# Patient Record
Sex: Female | Born: 1990 | Race: Black or African American | Hispanic: No | Marital: Married
Health system: Southern US, Community
[De-identification: ages and names within clinical notes are randomized; demographics above are authoritative.]

## PROBLEM LIST (undated history)

## (undated) DIAGNOSIS — I10 Essential (primary) hypertension: Secondary | ICD-10-CM

## (undated) DIAGNOSIS — D649 Anemia, unspecified: Secondary | ICD-10-CM

## (undated) DIAGNOSIS — E282 Polycystic ovarian syndrome: Secondary | ICD-10-CM

## (undated) DIAGNOSIS — J45909 Unspecified asthma, uncomplicated: Secondary | ICD-10-CM

## (undated) HISTORY — PX: LAPAROSCOPIC GASTRIC SLEEVE RESECTION: SHX5895

## (undated) HISTORY — PX: TUBAL LIGATION: SHX77

---

## 2018-04-29 ENCOUNTER — Encounter (HOSPITAL_COMMUNITY): Payer: Self-pay

## 2018-04-29 ENCOUNTER — Emergency Department (HOSPITAL_COMMUNITY): Payer: Medicaid Other

## 2018-04-29 ENCOUNTER — Emergency Department (HOSPITAL_COMMUNITY)
Admission: EM | Admit: 2018-04-29 | Discharge: 2018-04-29 | Disposition: A | Discharge status: Home / Self Care | Payer: Medicaid Other | Attending: Emergency Medicine | Admitting: Emergency Medicine

## 2018-04-29 ENCOUNTER — Other Ambulatory Visit: Payer: Self-pay

## 2018-04-29 DIAGNOSIS — R1030 Lower abdominal pain, unspecified: Secondary | ICD-10-CM | POA: Insufficient documentation

## 2018-04-29 DIAGNOSIS — I10 Essential (primary) hypertension: Secondary | ICD-10-CM | POA: Insufficient documentation

## 2018-04-29 DIAGNOSIS — Z7722 Contact with and (suspected) exposure to environmental tobacco smoke (acute) (chronic): Secondary | ICD-10-CM | POA: Diagnosis not present

## 2018-04-29 DIAGNOSIS — Z79899 Other long term (current) drug therapy: Secondary | ICD-10-CM | POA: Insufficient documentation

## 2018-04-29 HISTORY — DX: Anemia, unspecified: D64.9

## 2018-04-29 HISTORY — DX: Essential (primary) hypertension: I10

## 2018-04-29 HISTORY — DX: Polycystic ovarian syndrome: E28.2

## 2018-04-29 LAB — COMPREHENSIVE METABOLIC PANEL
ALBUMIN: 3.5 g/dL (ref 3.5–5.0)
ALK PHOS: 70 U/L (ref 38–126)
ALT: 12 U/L (ref 0–44)
AST: 14 U/L — AB (ref 15–41)
Anion gap: 5 (ref 5–15)
BILIRUBIN TOTAL: 0.4 mg/dL (ref 0.3–1.2)
BUN: 7 mg/dL (ref 6–20)
CALCIUM: 8.7 mg/dL — AB (ref 8.9–10.3)
CO2: 23 mmol/L (ref 22–32)
Chloride: 112 mmol/L — ABNORMAL HIGH (ref 98–111)
Creatinine, Ser: 0.64 mg/dL (ref 0.44–1.00)
GFR calc Af Amer: >60 mL/min (ref >60)
GFR calc non Af Amer: >60 mL/min (ref >60)
GLUCOSE: 93 mg/dL (ref 70–99)
Potassium: 3.4 mmol/L — ABNORMAL LOW (ref 3.5–5.1)
Sodium: 140 mmol/L (ref 135–145)
TOTAL PROTEIN: 6.9 g/dL (ref 6.5–8.1)

## 2018-04-29 LAB — CBC
HCT: 34.4 % — ABNORMAL LOW (ref 36.0–46.0)
Hemoglobin: 10.9 g/dL — ABNORMAL LOW (ref 12.0–15.0)
MCH: 23.9 pg — AB (ref 26.0–34.0)
MCHC: 31.7 g/dL (ref 30.0–36.0)
MCV: 75.4 fL — ABNORMAL LOW (ref 78.0–100.0)
Platelets: 323 10*3/uL (ref 150–400)
RBC: 4.56 MIL/uL (ref 3.87–5.11)
RDW: 19 % — ABNORMAL HIGH (ref 11.5–15.5)
WBC: 3.7 10*3/uL — ABNORMAL LOW (ref 4.0–10.5)

## 2018-04-29 LAB — LIPASE, BLOOD: Lipase: 27 U/L (ref 11–51)

## 2018-04-29 LAB — I-STAT BETA HCG BLOOD, ED (MC, WL, AP ONLY)

## 2018-04-29 LAB — URINALYSIS, ROUTINE W REFLEX MICROSCOPIC
BILIRUBIN URINE: NEGATIVE
Glucose, UA: NEGATIVE mg/dL
HGB URINE DIPSTICK: NEGATIVE
KETONES UR: NEGATIVE mg/dL
Leukocytes, UA: NEGATIVE
NITRITE: NEGATIVE
PH: 6 (ref 5.0–8.0)
Protein, ur: NEGATIVE mg/dL
Specific Gravity, Urine: 1.014 (ref 1.005–1.030)

## 2018-04-29 LAB — WET PREP, GENITAL
CLUE CELLS WET PREP: NONE SEEN
Sperm: NONE SEEN
Trich, Wet Prep: NONE SEEN
Yeast Wet Prep HPF POC: NONE SEEN

## 2018-04-29 MED ORDER — FENTANYL CITRATE (PF) 100 MCG/2ML IJ SOLN
50.0000 ug | Freq: Once | INTRAMUSCULAR | Status: AC
  Administered 2018-04-29: 50 ug via INTRAVENOUS
  Filled 2018-04-29: qty 2

## 2018-04-29 MED ORDER — IOPAMIDOL (ISOVUE-300) INJECTION 61%
100.0000 mL | Freq: Once | INTRAVENOUS | Status: AC | PRN
  Administered 2018-04-29: 100 mL via INTRAVENOUS

## 2018-04-29 MED ORDER — SODIUM CHLORIDE 0.9 % IV BOLUS
1000.0000 mL | Freq: Once | INTRAVENOUS | Status: AC
  Administered 2018-04-29: 1000 mL via INTRAVENOUS

## 2018-04-29 MED ORDER — IOPAMIDOL (ISOVUE-300) INJECTION 61%
INTRAVENOUS | Status: AC
  Filled 2018-04-29: qty 100

## 2018-04-29 MED ORDER — ONDANSETRON HCL 4 MG/2ML IJ SOLN
4.0000 mg | Freq: Once | INTRAMUSCULAR | Status: AC
  Administered 2018-04-29: 4 mg via INTRAVENOUS
  Filled 2018-04-29: qty 2

## 2018-04-29 MED ORDER — HYDROMORPHONE HCL 1 MG/ML IJ SOLN
0.5000 mg | Freq: Once | INTRAMUSCULAR | Status: AC
  Administered 2018-04-29: 0.5 mg via INTRAVENOUS
  Filled 2018-04-29: qty 1

## 2018-04-29 NOTE — ED Notes (Signed)
Pelvic cart at bedside. 

## 2018-04-29 NOTE — ED Notes (Signed)
Patient transported to CT 

## 2018-04-29 NOTE — ED Provider Notes (Signed)
  Physical Exam  BP 116/83 (BP Location: Right Arm)   Pulse (!) 59   Temp 98 F (36.7 C) (Oral)   Resp 18   Ht 5\' 6"  (1.676 m)   Wt 122.5 kg (270 lb)   LMP 02/07/2018   SpO2 100%   BMI 43.58 kg/m   Physical Exam  ED Course/Procedures     Procedures  MDM  Care assumed at 4 pm from Dr. Tamera Punt. Patient had abdominal pain and CT ab/pel was unremarkable. Had some more pain so pelvic exam was performed. Wet prep and US pelvis pending.  5:57 PM Wet prep showed some WBC but no clue cells or trich or yeast. Likely vaginal flora. US pelvis unremarkable. She has PCOS. I wonder if she has a ruptured cyst. Pain controlled. Has motrin at home. Stable for discharge.     Drenda Freeze, MD 04/29/18 [phone]

## 2018-04-29 NOTE — ED Notes (Signed)
Pt reports that the pain remains unchanged. Pt reports 9/10 pain. MD made aware

## 2018-04-29 NOTE — ED Triage Notes (Signed)
Patient c/o right mid abdominal pain and vomiting since last night. Patient denies fever or diarrhea.

## 2018-04-29 NOTE — Discharge Instructions (Signed)
Take motrin 800 mg every 6 hrs for pain.   See your doctor.  Rest for 2 days.   Return to ER if you have worse abdominal pain, vomiting, fever

## 2018-04-29 NOTE — ED Provider Notes (Signed)
[facility] DEPT Provider Note   CSN: [id number] Arrival date & time: 04/29/18  1241     History   Chief Complaint Chief Complaint  Patient presents with  . Abdominal Pain  . Emesis    HPI Peggy Andersen is a 27 y.o. female.  Patient is a 27 year old female with a history of hypertension, polycystic ovarian syndrome and prior gastric sleeve surgery who presents with abdominal pain.  She has a 2-day history of pain in her right abdomen.  It radiates a little bit to her back.  No urinary symptoms.  She is had some nausea and vomiting but no diarrhea.  No change in bowel habits.  No history of similar symptoms in the past.  No urinary symptoms other than she is going more frequently than normal.  She denies any known fevers.  She has used ibuprofen without improvement in symptoms.     Past Medical History:  Diagnosis Date  . Anemia   . Hypertension   . PCOS (polycystic ovarian syndrome)     There are no active problems to display for this patient.   Past Surgical History:  Procedure Laterality Date  . LAPAROSCOPIC GASTRIC SLEEVE RESECTION    . TUBAL LIGATION       OB History   None      Home Medications    Prior to Admission medications   Medication Sig Start Date End Date Taking? Authorizing Provider  albuterol (VENTOLIN HFA) 108 (90 Base) MCG/ACT inhaler Inhale into the lungs every 6 (six) hours as needed for wheezing or shortness of breath.   Yes [provider]  amLODipine-benazepril (LOTREL) 10-20 MG capsule Take 1 capsule by mouth daily.   Yes [provider]  CALCIUM PO Take 1 tablet by mouth daily.    Yes [provider]  Cholecalciferol (VITAMIN D PO) Take 1 tablet by mouth daily.    Yes [provider]  Cyanocobalamin (VITAMIN B-12 PO) Take 1 tablet by mouth daily.    Yes [provider]  fluticasone (FLOVENT HFA) 110 MCG/ACT inhaler Inhale 2 puffs into the lungs 2 (two)  times daily.   Yes [provider]  ibuprofen (ADVIL,MOTRIN) 200 MG tablet Take 400 mg by mouth daily as needed (pain).   Yes [provider]  IRON PO Take 1 tablet by mouth daily.    Yes [provider]  omeprazole (PRILOSEC) 40 MG capsule Take 40 mg by mouth daily.   Yes [provider]  thiamine (VITAMIN B-1) 100 MG tablet Take 100 mg by mouth daily.   Yes [provider]    Family History Family History  Problem Relation Age of Onset  . Stroke Mother   . Diabetes Mother   . Hypertension Mother     Social History Social History   Tobacco Use  . Smoking status: Passive Smoke Exposure - Never Smoker  . Smokeless tobacco: Never Used  Substance Use Topics  . Alcohol use: Never    Frequency: Never  . Drug use: Never     Allergies   Patient has no known allergies.   Review of Systems Review of Systems  Constitutional: Negative for chills, diaphoresis, fatigue and fever.  HENT: Negative for congestion, rhinorrhea and sneezing.   Eyes: Negative.   Respiratory: Negative for cough, chest tightness and shortness of breath.   Cardiovascular: Negative for chest pain and leg swelling.  Gastrointestinal: Positive for abdominal pain, nausea and vomiting. Negative for blood in stool and  diarrhea.  Genitourinary: Positive for frequency. Negative for difficulty urinating, flank pain and hematuria.  Musculoskeletal: Negative for arthralgias and back pain.  Skin: Negative for rash.  Neurological: Negative for dizziness, speech difficulty, weakness, numbness and headaches.     Physical Exam Updated Vital Signs BP 131/90 (BP Location: Right Arm)   Pulse 77   Temp 98 F (36.7 C) (Oral)   Resp 18   Ht 5\' 6"  (1.676 m)   Wt 122.5 kg (270 lb)   LMP 02/07/2018   SpO2 100%   BMI 43.58 kg/m   Physical Exam  Constitutional: She is oriented to person, place, and time. She appears well-developed and well-nourished.  HENT:  Head:  Normocephalic and atraumatic.  Eyes: Pupils are equal, round, and reactive to light.  Neck: Normal range of motion. Neck supple.  Cardiovascular: Normal rate, regular rhythm and normal heart sounds.  Pulmonary/Chest: Effort normal and breath sounds normal. No respiratory distress. She has no wheezes. She has no rales. She exhibits no tenderness.  Abdominal: Soft. Bowel sounds are normal. There is tenderness in the right lower quadrant. There is no rebound and no guarding.  Genitourinary:  Genitourinary Comments: Patient has yellow-greenish discharge.  No bleeding.  No cervical motion tenderness.  There is some right adnexal tenderness  Musculoskeletal: Normal range of motion. She exhibits no edema.  Lymphadenopathy:    She has no cervical adenopathy.  Neurological: She is alert and oriented to person, place, and time.  Skin: Skin is warm and dry. No rash noted.  Psychiatric: She has a normal mood and affect.     ED Treatments / Results  Labs (all labs ordered are listed, but only abnormal results are displayed) Labs Reviewed  COMPREHENSIVE METABOLIC PANEL - Abnormal; Notable for the following components:      Result Value   Potassium 3.4 (*)    Chloride 112 (*)    Calcium 8.7 (*)    AST 14 (*)    All other components within normal limits  CBC - Abnormal; Notable for the following components:   WBC 3.7 (*)    Hemoglobin 10.9 (*)    HCT 34.4 (*)    MCV 75.4 (*)    MCH 23.9 (*)    RDW 19.0 (*)    All other components within normal limits  WET PREP, GENITAL  LIPASE, BLOOD  URINALYSIS, ROUTINE W REFLEX MICROSCOPIC  RPR  HIV ANTIBODY (ROUTINE TESTING)  I-STAT BETA HCG BLOOD, ED ([facility], [facility], [facility] ONLY)  GC/CHLAMYDIA PROBE AMP ([place]) NOT AT [facility]    EKG None  Radiology Ct Abdomen Pelvis W Contrast  Result Date: 04/29/2018 CLINICAL DATA:  27 year old female with acute RIGHT abdominal and pelvic pain with nausea for 1 day. History of cholecystectomy, tubal ligation, gastric  sleeve. EXAM: CT ABDOMEN AND PELVIS WITH CONTRAST TECHNIQUE: Multidetector CT imaging of the abdomen and pelvis was performed using the standard protocol following bolus administration of intravenous contrast. CONTRAST:  165mL ISOVUE-300 IOPAMIDOL (ISOVUE-300) INJECTION 61% COMPARISON:  None. FINDINGS: Lower chest: No acute abnormalities. Hepatobiliary: No significant hepatic abnormalities. The patient is status post cholecystectomy. No biliary dilatation. Pancreas: Unremarkable Spleen: Unremarkable Adrenals/Urinary Tract: The kidneys, adrenal glands and bladder are unremarkable. Stomach/Bowel: Gastric surgical changes identified. There is no evidence of bowel obstruction, bowel wall thickening or inflammatory changes. The appendix is normal. Vascular/Lymphatic: No significant vascular findings are present. No enlarged abdominal or pelvic lymph nodes. Reproductive: Uterus and bilateral adnexa are unremarkable except for tubal ligation clips. Other: No  ascites, abscess or pneumoperitoneum. No abdominal wall hernia identified. Musculoskeletal: No acute or significant osseous findings. IMPRESSION: 1. No acute abnormality or CT findings to suggest a cause for this patient's abdominal pain. Normal appendix. Electronically Signed   By: Margarette Canada M.D.   On: 04/29/2018 14:52    Procedures Procedures (including critical care time)  Medications Ordered in ED Medications  iopamidol (ISOVUE-300) 61 % injection (has no administration in time range)  sodium chloride 0.9 % bolus 1,000 mL (1,000 mLs Intravenous New Bag/Given 04/29/18 1447)  fentaNYL (SUBLIMAZE) injection 50 mcg (50 mcg Intravenous Given 04/29/18 1353)  ondansetron (ZOFRAN) injection 4 mg (4 mg Intravenous Given 04/29/18 1353)  iopamidol (ISOVUE-300) 61 % injection 100 mL (100 mLs Intravenous Contrast Given 04/29/18 1429)  HYDROmorphone (DILAUDID) injection 0.5 mg (0.5 mg Intravenous Given 04/29/18 1457)     Initial Impression / Assessment and Plan / ED  Course  I have reviewed the triage vital signs and the nursing notes.  Pertinent labs & imaging results that were available during my care of the patient were reviewed by me and considered in my medical decision making (see chart for details).     Patient is a 27 year old female who presents with abdominal pain.  It seemed to be mostly in the right mid and lower abdomen.  CT scan was performed given her prior abdominal surgeries which showed no acute abnormalities.  A pelvic exam was performed which shows some discharge and right adnexal tenderness.  Urinalysis is pending.  Her labs show mildly low WBC count and some mild anemia.  She does state that she has a history of anemia and has had to have blood transfusions in the past.  I do not have any baseline labs to compare these to.  Patient is awaiting her pelvic ultrasound.  Dr. Darl Householder to follow.  Final Clinical Impressions(s) / ED Diagnoses   Final diagnoses:  None    ED Discharge Orders    None       Malvin Johns, MD 04/29/18 [phone]

## 2018-04-30 LAB — HIV ANTIBODY (ROUTINE TESTING W REFLEX): HIV Screen 4th Generation wRfx: NONREACTIVE

## 2018-04-30 LAB — RPR: RPR Ser Ql: NONREACTIVE

## 2018-05-01 LAB — GC/CHLAMYDIA PROBE AMP (~~LOC~~) NOT AT ARMC
CHLAMYDIA, DNA PROBE: NEGATIVE
NEISSERIA GONORRHEA: NEGATIVE

## 2018-07-15 ENCOUNTER — Other Ambulatory Visit: Payer: Self-pay

## 2018-07-15 ENCOUNTER — Emergency Department (HOSPITAL_COMMUNITY)
Admission: EM | Admit: 2018-07-15 | Discharge: 2018-07-15 | Disposition: A | Discharge status: Home / Self Care | Payer: Medicaid Other | Attending: Emergency Medicine | Admitting: Emergency Medicine

## 2018-07-15 ENCOUNTER — Encounter (HOSPITAL_COMMUNITY): Payer: Self-pay

## 2018-07-15 DIAGNOSIS — Z79899 Other long term (current) drug therapy: Secondary | ICD-10-CM | POA: Diagnosis not present

## 2018-07-15 DIAGNOSIS — J020 Streptococcal pharyngitis: Secondary | ICD-10-CM | POA: Diagnosis not present

## 2018-07-15 DIAGNOSIS — J029 Acute pharyngitis, unspecified: Secondary | ICD-10-CM | POA: Diagnosis present

## 2018-07-15 DIAGNOSIS — I10 Essential (primary) hypertension: Secondary | ICD-10-CM | POA: Insufficient documentation

## 2018-07-15 DIAGNOSIS — Z7722 Contact with and (suspected) exposure to environmental tobacco smoke (acute) (chronic): Secondary | ICD-10-CM | POA: Insufficient documentation

## 2018-07-15 LAB — GROUP A STREP BY PCR: GROUP A STREP BY PCR: DETECTED — AB

## 2018-07-15 MED ORDER — AMOXICILLIN 500 MG PO CAPS: 500.0000 mg | ORAL_CAPSULE | Freq: Two times a day (BID) | ORAL | 0 refills | Status: DC

## 2018-07-15 MED ORDER — AMOXICILLIN 500 MG PO CAPS: 500.0000 mg | ORAL_CAPSULE | Freq: Two times a day (BID) | ORAL | 0 refills | Status: AC

## 2018-07-15 NOTE — Discharge Instructions (Addendum)
You were seen in the ER for sore throat.  You tested positive for strep throat.  This is treated with antibiotic.  Take amoxicillin as prescribed.  Mainstay of treatment includes symptom control and anti-inflammatories.  Take 500 to 1000 mg of acetaminophen and/or 600 mg of ibuprofen every 6-8 hours to help with pain and inflammation.  Warm liquids such as tea with honey help with inflammation.  Return to the ER for worsening pain or swelling, "hot potato" muffled voice, drooling, inability to control your secretions or saliva, neck pain, shortness of breath, asymmetric neck or tonsil swelling or pain.

## 2018-07-15 NOTE — ED Triage Notes (Signed)
Pt presents for evaluation of 2 day hx of sore throat with intermittent headache and decreased appetite. Denies cough. States daughter has same symptoms. Denies fever.

## 2018-07-15 NOTE — ED Provider Notes (Addendum)
[facility] EMERGENCY DEPARTMENT Provider Note   CSN: [id number] Arrival date & time: 07/15/18  7564     History   Chief Complaint Chief Complaint  Patient presents with  . Sore Throat    HPI Peggy Andersen is a 27 y.o. female with history of hypertension is here for evaluation of sore throat.  Onset 2 days ago.  Sore throat is moderate, constant, gradually worsening.  Worse with talking, swallowing.  Has taken ibuprofen with mild, temporary relief.  Associated with voice hoarseness.  Patient 21-year-old daughter has similar symptoms.  She denies associated fever, congestion, ear pain, cough, nausea, vomiting, or abdominal pain.  HPI  Past Medical History:  Diagnosis Date  . Anemia   . Hypertension   . PCOS (polycystic ovarian syndrome)     There are no active problems to display for this patient.   Past Surgical History:  Procedure Laterality Date  . LAPAROSCOPIC GASTRIC SLEEVE RESECTION    . TUBAL LIGATION       OB History   None      Home Medications    Prior to Admission medications   Medication Sig Start Date End Date Taking? Authorizing Provider  albuterol (VENTOLIN HFA) 108 (90 Base) MCG/ACT inhaler Inhale into the lungs every 6 (six) hours as needed for wheezing or shortness of breath.    [provider]  amLODipine-benazepril (LOTREL) 10-20 MG capsule Take 1 capsule by mouth daily.    [provider]  amoxicillin (AMOXIL) 500 MG capsule Take 1 capsule (500 mg total) by mouth 2 (two) times daily for 10 days. 07/15/18 07/25/18  Kinnie Feil, PA-C  CALCIUM PO Take 1 tablet by mouth daily.     [provider]  Cholecalciferol (VITAMIN D PO) Take 1 tablet by mouth daily.     [provider]  Cyanocobalamin (VITAMIN B-12 PO) Take 1 tablet by mouth daily.     [provider]  fluticasone (FLOVENT HFA) 110 MCG/ACT inhaler Inhale 2 puffs into the lungs 2 (two) times daily.    [provider]  ibuprofen (ADVIL,MOTRIN) 200 MG tablet Take 400 mg by mouth daily as needed (pain).    [provider]  IRON PO Take 1 tablet by mouth daily.     [provider]  omeprazole (PRILOSEC) 40 MG capsule Take 40 mg by mouth daily.    [provider]  thiamine (VITAMIN B-1) 100 MG tablet Take 100 mg by mouth daily.    [provider]    Family History Family History  Problem Relation Age of Onset  . Stroke Mother   . Diabetes Mother   . Hypertension Mother     Social History Social History   Tobacco Use  . Smoking status: Passive Smoke Exposure - Never Smoker  . Smokeless tobacco: Never Used  Substance Use Topics  . Alcohol use: Never    Frequency: Never  . Drug use: Never     Allergies   Patient has no known allergies.   Review of Systems Review of Systems  HENT: Positive for sore throat, trouble swallowing and voice change.   All other systems reviewed and are negative.    Physical Exam Updated Vital Signs BP (!) 133/106 (BP Location: Right Arm)   Pulse 81   Temp 98.9 F (37.2 C) (Oral)   Resp 16   LMP 05/28/2018 (Approximate)   SpO2 100%   Physical Exam  Constitutional: She appears well-developed and well-nourished. No distress.  NAD.  HENT:  Head: Normocephalic and atraumatic.  Right Ear: External ear normal.  Left Ear: External ear normal.  Nose: Nose normal.  Mouth/Throat: Mucous membranes are normal. Posterior oropharyngeal erythema present. Tonsils are 1+ on the right. Tonsils are 1+ on the left.  Tonsils and posterior oropharynx are erythematous.  Tonsils are mildly, symmetrically edematous without touching uvula.  Uvula is midline.  No uvula deviation.  No tonsillar or oropharyngeal exudates.  No petechiae.  Normal phonation, no hot potato voice.  Normal protrusion of the tongue.  No sublingual edema or tenderness.  TMs normal.  No significant mucosal edema or rhinorrhea.  Eyes: Conjunctivae and EOM are  normal. No scleral icterus.  Neck: Normal range of motion. Neck supple.  No cervical adenopathy.  No asymmetric anterior neck edema.  Cardiovascular: Normal rate, regular rhythm and normal heart sounds.  Pulmonary/Chest: Effort normal and breath sounds normal.  Musculoskeletal: Normal range of motion. She exhibits no deformity.  Neurological: She is alert.  Skin: Skin is warm and dry. Capillary refill takes less than 2 seconds.  Psychiatric: She has a normal mood and affect. Her behavior is normal. Judgment and thought content normal.  Nursing note and vitals reviewed.    ED Treatments / Results  Labs (all labs ordered are listed, but only abnormal results are displayed) Labs Reviewed  GROUP A STREP BY PCR - Abnormal; Notable for the following components:      Result Value   Group A Strep by PCR DETECTED (*)    All other components within normal limits    EKG None  Radiology No results found.  Procedures Procedures (including critical care time)  Medications Ordered in ED Medications - No data to display   Initial Impression / Assessment and Plan / ED Course  I have reviewed the triage vital signs and the nursing notes.  Pertinent labs & imaging results that were available during my care of the patient were reviewed by me and considered in my medical decision making (see chart for details).     27 y.o. yo female here with sore throat. Given benign symptomatology most likely viral pharyngitis vs strep pharyngitis vs other viral URI. No signs of angioedema or respiratory compromise.. No asymmetry to tonsillar hypertrophy, uvula deviation, hot potato voice, trismus, or drooling to raise suspicion for deep neck space infection such as RPA, ludwig's or PTA. Doubt epiglottitis in this well appearing fully vaccinated patient. Rapid strep positive. Will discharge with amoxicillin, symptomatic management. I have discussed signs and symptoms that would warrant immediate return to ER  and patient verbalized understanding. She is tolerating secretions and PO in ER.   Old records, if available, reviewed by me. Imaging and labs viewed and interpreted by me and used in the medical decision making (formal interpretation from radiologist). Discharge home in stable condition, return precautions discussed. Patient, family agreeable with plan for discharge home.   Final Clinical Impressions(s) / ED Diagnoses   Final diagnoses:  Strep pharyngitis    ED Discharge Orders         Ordered    amoxicillin (AMOXIL) 500 MG capsule  2 times daily,   Status:  Discontinued     07/15/18 1017    amoxicillin (AMOXIL) 500 MG capsule  2 times daily     07/15/18 1044             Arlean Hopping 07/15/18 1138    Jola Schmidt, MD 07/16/18 [phone]

## 2018-09-28 ENCOUNTER — Emergency Department (HOSPITAL_COMMUNITY): Payer: Medicaid Other

## 2018-09-28 ENCOUNTER — Encounter (HOSPITAL_COMMUNITY): Payer: Self-pay

## 2018-09-28 ENCOUNTER — Emergency Department (HOSPITAL_COMMUNITY)
Admission: EM | Admit: 2018-09-28 | Discharge: 2018-09-28 | Disposition: A | Discharge status: Home / Self Care | Payer: Medicaid Other | Attending: Emergency Medicine | Admitting: Emergency Medicine

## 2018-09-28 DIAGNOSIS — I10 Essential (primary) hypertension: Secondary | ICD-10-CM | POA: Diagnosis not present

## 2018-09-28 DIAGNOSIS — Z7722 Contact with and (suspected) exposure to environmental tobacco smoke (acute) (chronic): Secondary | ICD-10-CM | POA: Diagnosis not present

## 2018-09-28 DIAGNOSIS — D649 Anemia, unspecified: Secondary | ICD-10-CM

## 2018-09-28 DIAGNOSIS — J45909 Unspecified asthma, uncomplicated: Secondary | ICD-10-CM | POA: Insufficient documentation

## 2018-09-28 DIAGNOSIS — R110 Nausea: Secondary | ICD-10-CM | POA: Insufficient documentation

## 2018-09-28 DIAGNOSIS — Z9884 Bariatric surgery status: Secondary | ICD-10-CM | POA: Diagnosis not present

## 2018-09-28 DIAGNOSIS — R6883 Chills (without fever): Secondary | ICD-10-CM | POA: Diagnosis not present

## 2018-09-28 DIAGNOSIS — R1011 Right upper quadrant pain: Secondary | ICD-10-CM | POA: Diagnosis not present

## 2018-09-28 HISTORY — DX: Unspecified asthma, uncomplicated: J45.909

## 2018-09-28 LAB — CBC
HCT: 33.5 % — ABNORMAL LOW (ref 36.0–46.0)
Hemoglobin: 9.7 g/dL — ABNORMAL LOW (ref 12.0–15.0)
MCH: 21.7 pg — ABNORMAL LOW (ref 26.0–34.0)
MCHC: 29 g/dL — ABNORMAL LOW (ref 30.0–36.0)
MCV: 74.8 fL — ABNORMAL LOW (ref 80.0–100.0)
Platelets: 355 10*3/uL (ref 150–400)
RBC: 4.48 MIL/uL (ref 3.87–5.11)
RDW: 17.7 % — ABNORMAL HIGH (ref 11.5–15.5)
WBC: 3.9 10*3/uL — ABNORMAL LOW (ref 4.0–10.5)
nRBC: 0 % (ref 0.0–0.2)

## 2018-09-28 LAB — COMPREHENSIVE METABOLIC PANEL
ALBUMIN: 3.7 g/dL (ref 3.5–5.0)
ALT: 12 U/L (ref 0–44)
AST: 13 U/L — ABNORMAL LOW (ref 15–41)
Alkaline Phosphatase: 63 U/L (ref 38–126)
Anion gap: 8 (ref 5–15)
BUN: 7 mg/dL (ref 6–20)
CO2: 22 mmol/L (ref 22–32)
Calcium: 8.6 mg/dL — ABNORMAL LOW (ref 8.9–10.3)
Chloride: 109 mmol/L (ref 98–111)
Creatinine, Ser: 0.65 mg/dL (ref 0.44–1.00)
GFR calc Af Amer: >60 mL/min (ref >60)
GFR calc non Af Amer: >60 mL/min (ref >60)
Glucose, Bld: 82 mg/dL (ref 70–99)
Potassium: 3.6 mmol/L (ref 3.5–5.1)
Sodium: 139 mmol/L (ref 135–145)
Total Bilirubin: 0.4 mg/dL (ref 0.3–1.2)
Total Protein: 7.1 g/dL (ref 6.5–8.1)

## 2018-09-28 LAB — URINALYSIS, ROUTINE W REFLEX MICROSCOPIC
Bilirubin Urine: NEGATIVE
GLUCOSE, UA: NEGATIVE mg/dL
Hgb urine dipstick: NEGATIVE
Ketones, ur: NEGATIVE mg/dL
Nitrite: NEGATIVE
Protein, ur: NEGATIVE mg/dL
Specific Gravity, Urine: 1.016 (ref 1.005–1.030)
pH: 5 (ref 5.0–8.0)

## 2018-09-28 LAB — I-STAT BETA HCG BLOOD, ED (MC, WL, AP ONLY): I-stat hCG, quantitative: <5 m[IU]/mL (ref <5)

## 2018-09-28 LAB — D-DIMER, QUANTITATIVE (NOT AT ARMC): D DIMER QUANT: 0.39 ug{FEU}/mL (ref 0.00–0.50)

## 2018-09-28 LAB — LIPASE, BLOOD: Lipase: 25 U/L (ref 11–51)

## 2018-09-28 MED ORDER — FENTANYL CITRATE (PF) 100 MCG/2ML IJ SOLN
50.0000 ug | Freq: Once | INTRAMUSCULAR | Status: AC
  Administered 2018-09-28: 50 ug via INTRAVENOUS
  Filled 2018-09-28: qty 2

## 2018-09-28 MED ORDER — KETOROLAC TROMETHAMINE 30 MG/ML IJ SOLN
15.0000 mg | Freq: Once | INTRAMUSCULAR | Status: AC
  Administered 2018-09-28: 15 mg via INTRAVENOUS
  Filled 2018-09-28: qty 1

## 2018-09-28 MED ORDER — OMEPRAZOLE 20 MG PO CPDR: 20.0000 mg | DELAYED_RELEASE_CAPSULE | Freq: Every day | ORAL | 0 refills | Status: AC

## 2018-09-28 MED ORDER — HYDROMORPHONE HCL 1 MG/ML IJ SOLN
1.0000 mg | Freq: Once | INTRAMUSCULAR | Status: AC
  Administered 2018-09-28: 1 mg via INTRAVENOUS
  Filled 2018-09-28: qty 1

## 2018-09-28 MED ORDER — IOPAMIDOL (ISOVUE-300) INJECTION 61%
100.0000 mL | Freq: Once | INTRAVENOUS | Status: AC | PRN
  Administered 2018-09-28: 100 mL via INTRAVENOUS

## 2018-09-28 MED ORDER — FAMOTIDINE IN NACL 20-0.9 MG/50ML-% IV SOLN
20.0000 mg | Freq: Once | INTRAVENOUS | Status: AC
  Administered 2018-09-28: 20 mg via INTRAVENOUS
  Filled 2018-09-28: qty 50

## 2018-09-28 MED ORDER — SODIUM CHLORIDE 0.9 % IV BOLUS
1000.0000 mL | Freq: Once | INTRAVENOUS | Status: AC
  Administered 2018-09-28: 1000 mL via INTRAVENOUS

## 2018-09-28 MED ORDER — ONDANSETRON HCL 4 MG/2ML IJ SOLN
4.0000 mg | Freq: Once | INTRAMUSCULAR | Status: AC
  Administered 2018-09-28: 4 mg via INTRAVENOUS
  Filled 2018-09-28: qty 2

## 2018-09-28 MED ORDER — IOPAMIDOL (ISOVUE-300) INJECTION 61%
INTRAVENOUS | Status: AC
  Filled 2018-09-28: qty 100

## 2018-09-28 MED ORDER — OXYCODONE-ACETAMINOPHEN 5-325 MG PO TABS: 2.0000 | ORAL_TABLET | ORAL | 0 refills | Status: DC | PRN

## 2018-09-28 MED ORDER — MORPHINE SULFATE (PF) 4 MG/ML IV SOLN
4.0000 mg | Freq: Once | INTRAVENOUS | Status: AC
  Administered 2018-09-28: 4 mg via INTRAVENOUS
  Filled 2018-09-28: qty 1

## 2018-09-28 MED ORDER — ALUM & MAG HYDROXIDE-SIMETH 200-200-20 MG/5ML PO SUSP
30.0000 mL | Freq: Once | ORAL | Status: AC
  Administered 2018-09-28: 30 mL via ORAL
  Filled 2018-09-28: qty 30

## 2018-09-28 MED ORDER — SODIUM CHLORIDE (PF) 0.9 % IJ SOLN
INTRAMUSCULAR | Status: AC
  Filled 2018-09-28: qty 50

## 2018-09-28 NOTE — ED Notes (Signed)
Patient transported to CT 

## 2018-09-28 NOTE — Discharge Instructions (Addendum)
Please take Percocet for severe pain as needed Take Omeprazole for stomach pain (acid reducer) Follow up with GI  Return if worsening

## 2018-09-28 NOTE — ED Triage Notes (Signed)
Patient c/o abdominal pain Started yesterday morning and progressively gotten worse Right upper sharp abd. 9/10 Pain.  C/O chills Denies n/v or diarrhea.   A/Ox4 Ambulatory in triage.

## 2018-09-28 NOTE — ED Notes (Signed)
Pt sts she is unable to give a urine sample at this time. Pt given a labeled urine cup for when she can urinate

## 2018-09-28 NOTE — ED Provider Notes (Signed)
[facility] DEPT Provider Note   CSN: [id number] Arrival date & time: 09/28/18  1058     History   Chief Complaint Chief Complaint  Patient presents with  . Abdominal Pain    HPI Peggy Andersen is a 27 y.o. female who presents with right upper quadrant abdominal pain.  Past medical history significant for hypertension, PCOS, asthma, anemia.  Patient states that yesterday morning she woke up with right upper quadrant abdominal pain.  Is constant and radiates to her back.  She has been alternating Tylenol and ibuprofen without relief.  Nothing makes it better or worse.  She has never had this pain before.  She reports associated chills.  She has not urinated since yesterday states her urine is dark.  She denies fever, chest pain, cough, shortness of breath, nausea, vomiting, diarrhea, constipation, dysuria, frequency, hematuria, vaginal discharge or bleeding. Past surgical history significant for gastric sleeve resection, tubal ligation, C-section, cholecystectomy. She had a CT and pelvic US in July which was normal.  HPI  Past Medical History:  Diagnosis Date  . Anemia   . Asthma   . Hypertension   . PCOS (polycystic ovarian syndrome)     There are no active problems to display for this patient.   Past Surgical History:  Procedure Laterality Date  . LAPAROSCOPIC GASTRIC SLEEVE RESECTION    . TUBAL LIGATION       OB History   None      Home Medications    Prior to Admission medications   Medication Sig Start Date End Date Taking? Authorizing Provider  amLODipine-benazepril (LOTREL) 10-20 MG capsule Take 1 capsule by mouth daily.   Yes [provider]  ferrous sulfate 325 (65 FE) MG tablet Take 325 mg by mouth 3 (three) times daily with meals.   Yes [provider]    Family History Family History  Problem Relation Age of Onset  . Stroke Mother   . Diabetes Mother   . Hypertension Mother     Social  History Social History   Tobacco Use  . Smoking status: Passive Smoke Exposure - Never Smoker  . Smokeless tobacco: Never Used  Substance Use Topics  . Alcohol use: Never    Frequency: Never  . Drug use: Never     Allergies   Patient has no known allergies.   Review of Systems Review of Systems  Constitutional: Positive for chills. Negative for appetite change and fever.  Respiratory: Negative for cough and shortness of breath.   Cardiovascular: Negative for chest pain and leg swelling.  Gastrointestinal: Positive for abdominal pain. Negative for constipation, diarrhea, nausea and vomiting.  Genitourinary: Negative for difficulty urinating, dysuria, flank pain, frequency, pelvic pain, vaginal bleeding and vaginal discharge.  All other systems reviewed and are negative.    Physical Exam Updated Vital Signs BP (!) 151/108   Pulse 82   Temp 98.3 F (36.8 C) (Oral)   Resp 16   Ht 5\' 6"  (1.676 m)   Wt 121.6 kg   LMP 08/12/2018   SpO2 100%   BMI 43.26 kg/m   Physical Exam  Constitutional: She is oriented to person, place, and time. She appears well-developed and well-nourished. No distress.  Calm and cooperative  HENT:  Head: Normocephalic and atraumatic.  Eyes: Pupils are equal, round, and reactive to light. Conjunctivae are normal. Right eye exhibits no discharge. Left eye exhibits no discharge. No scleral icterus.  Neck: Normal range of motion.  Cardiovascular: Normal rate  and regular rhythm.  Pulmonary/Chest: Effort normal and breath sounds normal. No respiratory distress.  Abdominal: Soft. Bowel sounds are normal. She exhibits no distension and no mass. There is tenderness (RUQ tenderness). There is no rebound and no guarding. No hernia.  No CVA tenderness  Neurological: She is alert and oriented to person, place, and time.  Skin: Skin is warm and dry.  Psychiatric: She has a normal mood and affect. Her behavior is normal.  Nursing note and vitals  reviewed.    ED Treatments / Results  Labs (all labs ordered are listed, but only abnormal results are displayed) Labs Reviewed  COMPREHENSIVE METABOLIC PANEL - Abnormal; Notable for the following components:      Result Value   Calcium 8.6 (*)    AST 13 (*)    All other components within normal limits  CBC - Abnormal; Notable for the following components:   WBC 3.9 (*)    Hemoglobin 9.7 (*)    HCT 33.5 (*)    MCV 74.8 (*)    MCH 21.7 (*)    MCHC 29.0 (*)    RDW 17.7 (*)    All other components within normal limits  URINALYSIS, ROUTINE W REFLEX MICROSCOPIC - Abnormal; Notable for the following components:   Leukocytes, UA TRACE (*)    Bacteria, UA RARE (*)    All other components within normal limits  LIPASE, BLOOD  I-STAT BETA HCG BLOOD, ED ([facility], [facility], [facility] ONLY)    EKG None  Radiology Ct Abdomen Pelvis W Contrast  Result Date: 09/28/2018 CLINICAL DATA:  Abdominal pain. EXAM: CT ABDOMEN AND PELVIS WITH CONTRAST TECHNIQUE: Multidetector CT imaging of the abdomen and pelvis was performed using the standard protocol following bolus administration of intravenous contrast. CONTRAST:  171mL ISOVUE-300 IOPAMIDOL (ISOVUE-300) INJECTION 61% COMPARISON:  CT AP 04/29/2018 FINDINGS: Lower chest: No acute abnormality. Hepatobiliary: There are scattered low-density foci within the liver which appears similar to previous exam. For example within segment 4 a there is a 1.2 cm low attenuation structure, image 18/2. In segment 4B there is a 1.0 cm low attenuation structure, image 30/2. Also similar to previous exam. Within segment 6 there is a 8 mm low-attenuation structure, image 41/2. Unchanged from previous exam. Previous cholecystectomy. Mild fusiform dilatation of the CBD measures up to 9 mm. No choledocholithiasis. Pancreas: Unremarkable. No pancreatic ductal dilatation or surrounding inflammatory changes. Spleen: Normal in size without focal abnormality. Adrenals/Urinary Tract: Normal appearance  of the adrenal glands. No kidney mass or hydronephrosis identified. Urinary bladder appears normal. Stomach/Bowel: Postoperative change from gastric sleeve resection surgery noted. No pathologically dilated loops of large or small bowel identified. No small bowel wall thickening or inflammation identified. The appendix is visualized and appears normal. Vascular/Lymphatic: Normal appearance of the abdominal aorta. No abdominopelvic adenopathy. Reproductive: Uterus and bilateral adnexa are unremarkable. Status post bilateral tubal ligation. Other: No abdominal wall hernia or abnormality. No abdominopelvic ascites. Musculoskeletal: No acute or significant osseous findings. IMPRESSION: 1. No acute findings within the abdomen or pelvis. No significant change compared with 04/29/2018. 2. There are several scattered low-density foci within the liver which are indeterminate. These were present on exam from 04/29/2018 and are favored to represent a benign process such as multiple liver hemangiomas. Suggest followup imaging with nonemergent contrast enhanced MRI of the liver. Electronically Signed   By: Kerby Moors M.D.   On: 09/28/2018 19:56   US Abdomen Limited Ruq  Result Date: 09/28/2018 CLINICAL DATA:  Right upper quadrant pain.  EXAM: ULTRASOUND ABDOMEN LIMITED RIGHT UPPER QUADRANT COMPARISON:  04/29/2018. FINDINGS: Gallbladder: Surgically absent. Common bile duct: Diameter: 7-8 mm Liver: Parenchyma appears subtly heterogeneous with slight increase in echogenicity. Portal vein is patent on color Doppler imaging with normal direction of blood flow towards the liver. IMPRESSION: 1. Status post cholecystectomy. 2. Mild prominence extrahepatic bile ducts, not substantially changed since prior CT. This may reflect changes from prior cholecystectomy. Correlation with liver function tests suggested. Electronically Signed   By: Misty Stanley M.D.   On: 09/28/2018 16:09    Procedures Procedures (including critical care  time)  Medications Ordered in ED Medications  iopamidol (ISOVUE-300) 61 % injection (has no administration in time range)  sodium chloride (PF) 0.9 % injection (has no administration in time range)  alum & mag hydroxide-simeth (MAALOX/MYLANTA) 200-200-20 MG/5ML suspension 30 mL (has no administration in time range)  famotidine (PEPCID) IVPB 20 mg premix (has no administration in time range)  sodium chloride 0.9 % bolus 1,000 mL (0 mLs Intravenous Stopped 09/28/18 1812)  morphine 4 MG/ML injection 4 mg (4 mg Intravenous Given 09/28/18 1540)  HYDROmorphone (DILAUDID) injection 1 mg (1 mg Intravenous Given 09/28/18 1712)  ondansetron (ZOFRAN) injection 4 mg (4 mg Intravenous Given 09/28/18 1712)  iopamidol (ISOVUE-300) 61 % injection 100 mL (100 mLs Intravenous Contrast Given 09/28/18 1859)     Initial Impression / Assessment and Plan / ED Course  I have reviewed the triage vital signs and the nursing notes.  Pertinent labs & imaging results that were available during my care of the patient were reviewed by me and considered in my medical decision making (see chart for details).  27 year old female presents with RUQ abdominal pain. She is hypertensive but otherwise vitals are normal. She is tender in the RUQ. Will order labs, UA. She was given pain control and fluids. RUQ [place] ordered.  CBC is remarkable for leukopenia (3.9) and slightly worse anemia (9.7). CMP and lipase are unremarkable. UA is normal. US shows prominence of bile ducts. Unclear significance of this in the setting of normal liver function.  On recheck she is still very uncomfortable and is now nauseous. Will order additional pain medicine and nausea med. Will order CT abdomen/pelvis. Shared visit with Dr. Lacinda Axon.   7:30 PM CT is normal. Reassessed and she is still uncomfortable. Will give pepcid and mylanta to see if this improves pain. She may have PUD?  9:30 PM Rechecked pt. She is still having significant pain. Nothing we have  given her tonight has helped. Although less likely, will obtain D-dimer and CXR to f/o anything acute in the chest. Toradol and Fentanyl ordered.  Pain is still not improved. CXR and D-dimer are normal. Unclear etiology. She will be given rx for Percocet and Omepazole, GI f/u, and return precautions.  Final Clinical Impressions(s) / ED Diagnoses   Final diagnoses:  RUQ pain  Anemia, unspecified type    ED Discharge Orders    None       Recardo Evangelist, PA-C 09/28/18 2309    Virgel Manifold, MD 09/30/18 1827

## 2018-11-28 ENCOUNTER — Emergency Department (HOSPITAL_COMMUNITY): Payer: Medicaid Other

## 2018-11-28 ENCOUNTER — Emergency Department (HOSPITAL_COMMUNITY)
Admission: EM | Admit: 2018-11-28 | Discharge: 2018-11-28 | Disposition: A | Discharge status: Home / Self Care | Payer: Medicaid Other | Attending: Emergency Medicine | Admitting: Emergency Medicine

## 2018-11-28 DIAGNOSIS — R51 Headache: Secondary | ICD-10-CM | POA: Insufficient documentation

## 2018-11-28 DIAGNOSIS — R000 Tachycardia, unspecified: Secondary | ICD-10-CM | POA: Diagnosis not present

## 2018-11-28 DIAGNOSIS — I10 Essential (primary) hypertension: Secondary | ICD-10-CM | POA: Insufficient documentation

## 2018-11-28 DIAGNOSIS — Z79899 Other long term (current) drug therapy: Secondary | ICD-10-CM | POA: Diagnosis not present

## 2018-11-28 DIAGNOSIS — R600 Localized edema: Secondary | ICD-10-CM | POA: Diagnosis not present

## 2018-11-28 DIAGNOSIS — R55 Syncope and collapse: Secondary | ICD-10-CM | POA: Insufficient documentation

## 2018-11-28 LAB — BASIC METABOLIC PANEL
ANION GAP: 9 (ref 5–15)
BUN: <5 mg/dL — ABNORMAL LOW (ref 6–20)
CO2: 21 mmol/L — ABNORMAL LOW (ref 22–32)
Calcium: 8.3 mg/dL — ABNORMAL LOW (ref 8.9–10.3)
Chloride: 111 mmol/L (ref 98–111)
Creatinine, Ser: 0.66 mg/dL (ref 0.44–1.00)
Glucose, Bld: 64 mg/dL — ABNORMAL LOW (ref 70–99)
Potassium: 3.2 mmol/L — ABNORMAL LOW (ref 3.5–5.1)
Sodium: 141 mmol/L (ref 135–145)

## 2018-11-28 LAB — CBC WITH DIFFERENTIAL/PLATELET
Abs Immature Granulocytes: 0.02 10*3/uL (ref 0.00–0.07)
Basophils Absolute: 0 10*3/uL (ref 0.0–0.1)
Basophils Relative: 0 %
Eosinophils Absolute: 0 10*3/uL (ref 0.0–0.5)
Eosinophils Relative: 1 %
HCT: 32 % — ABNORMAL LOW (ref 36.0–46.0)
HEMOGLOBIN: 9.3 g/dL — AB (ref 12.0–15.0)
Immature Granulocytes: 0 %
Lymphocytes Relative: 36 %
Lymphs Abs: 1.8 10*3/uL (ref 0.7–4.0)
MCH: 21 pg — ABNORMAL LOW (ref 26.0–34.0)
MCHC: 29.1 g/dL — ABNORMAL LOW (ref 30.0–36.0)
MCV: 72.2 fL — ABNORMAL LOW (ref 80.0–100.0)
Monocytes Absolute: 0.4 10*3/uL (ref 0.1–1.0)
Monocytes Relative: 8 %
NEUTROS PCT: 55 %
Neutro Abs: 2.9 10*3/uL (ref 1.7–7.7)
Platelets: 323 10*3/uL (ref 150–400)
RBC: 4.43 MIL/uL (ref 3.87–5.11)
RDW: 17.6 % — ABNORMAL HIGH (ref 11.5–15.5)
WBC: 5.2 10*3/uL (ref 4.0–10.5)
nRBC: 0 % (ref 0.0–0.2)

## 2018-11-28 MED ORDER — SODIUM CHLORIDE 0.9 % IV BOLUS
1000.0000 mL | Freq: Once | INTRAVENOUS | Status: AC
  Administered 2018-11-28: 1000 mL via INTRAVENOUS

## 2018-11-28 MED ORDER — IOPAMIDOL (ISOVUE-370) INJECTION 76%
75.0000 mL | Freq: Once | INTRAVENOUS | Status: AC | PRN
  Administered 2018-11-28: 75 mL via INTRAVENOUS

## 2018-11-28 MED ORDER — IOPAMIDOL (ISOVUE-370) INJECTION 76%
INTRAVENOUS | Status: AC
  Filled 2018-11-28: qty 100

## 2018-11-28 NOTE — ED Triage Notes (Signed)
Pt states she was told by her husband that she passed out this morning. Pt endorses feeling as though her heart was racing. Pt denies chest pain at time of triage. Pt is alert and oriented.

## 2018-11-28 NOTE — ED Notes (Signed)
Pt alert and oriented in NAD. Pt verbalized understanding of discharge instructions. 

## 2018-11-28 NOTE — Discharge Instructions (Addendum)
Follow up with your doctor. Return to ER for any new or worsening symptoms. Home to rest, low salt diet, elevate feet, wear compression hose to help with swelling.

## 2018-11-28 NOTE — ED Notes (Signed)
Patient transported to CT 

## 2018-11-28 NOTE — ED Provider Notes (Signed)
[facility]ERGENCY DEPARTMENT Provider Note   CSN: [id number] Arrival date & time: 11/28/18  1243     History   Chief Complaint Chief Complaint  Patient presents with  . Tachycardia    HPI Peggy Andersen is a 28 y.o. female.  28 year old female presents with complaint of syncope.  Patient states that she did not sleep well last night, was up early to pick her husband up this morning and picked up her medication from the pharmacy.  Patient checked her blood pressure while at the pharmacy like she always does and states that this time her blood pressure was elevated at 170/120, states her blood pressure is normally within normal limits and does not take medication for blood pressure.  Patient states that she went back home and took a nap this morning.  Patient woke up from her nap and sat up on the edge of the bed to go to the bathroom, states that she felt warm all of a sudden, stood up to go to the bathroom and passed out.  Patient's husband was with her at that time, she passed out landing on carpeted floor, awoke to him stating over her calling her name.  Patient was assisted back into bed and later ambulated to the bathroom without further incident.  Patient rested for period of time and then got up this morning and decided she should come to the ER to be seen.  Patient's only complaint at this time is a tender area on the right side of her head where she assumed she may have hit the side of her head on something when she passed out.  Patient denies chest pain, shortness of breath, palpitations.  On exam patient was found to have swollen ankles, she reports that this is new for her this morning, no prior history of same. Patient has a history of anemia, takes an iron supplement, no other medical history.      Past Medical History:  Diagnosis Date  . Anemia   . Asthma   . Hypertension   . PCOS (polycystic ovarian syndrome)     There are no active  problems to display for this patient.   Past Surgical History:  Procedure Laterality Date  . LAPAROSCOPIC GASTRIC SLEEVE RESECTION    . TUBAL LIGATION       OB History   No obstetric history on file.      Home Medications    Prior to Admission medications   Medication Sig Start Date End Date Taking? Authorizing Provider  albuterol (VENTOLIN HFA) 108 (90 Base) MCG/ACT inhaler Inhale 2 puffs into the lungs every 6 (six) hours as needed for wheezing or shortness of breath.   Yes [provider]  ferrous sulfate 325 (65 FE) MG tablet Take 325 mg by mouth 3 (three) times daily with meals.   Yes [provider]  omeprazole (PRILOSEC) 20 MG capsule Take 1 capsule (20 mg total) by mouth daily. 09/28/18  Yes Recardo Evangelist, PA-C  vitamin B-12 (CYANOCOBALAMIN) 1000 MCG tablet Take 1,000 mcg by mouth daily.   Yes [provider]  oxyCODONE-acetaminophen (PERCOCET/ROXICET) 5-325 MG tablet Take 2 tablets by mouth every 4 (four) hours as needed for severe pain. Patient not taking: Reported on 11/28/2018 09/28/18   Recardo Evangelist, PA-C    Family History Family History  Problem Relation Age of Onset  . Stroke Mother   . Diabetes Mother   . Hypertension Mother     Social  History Social History   Tobacco Use  . Smoking status: Passive Smoke Exposure - Never Smoker  . Smokeless tobacco: Never Used  Substance Use Topics  . Alcohol use: Never    Frequency: Never  . Drug use: Never     Allergies   Percocet [oxycodone-acetaminophen]   Review of Systems Review of Systems  Constitutional: Negative for chills and fever.  Respiratory: Negative for shortness of breath.   Cardiovascular: Positive for leg swelling. Negative for chest pain and palpitations.  Gastrointestinal: Negative for abdominal pain, nausea and vomiting.  Musculoskeletal: Negative for arthralgias, back pain, myalgias and neck pain.  Skin: Negative for color change, rash and wound.    Allergic/Immunologic: Negative for immunocompromised state.  Neurological: Positive for syncope and headaches. Negative for dizziness, seizures, speech difficulty and weakness.  Hematological: Does not bruise/bleed easily.  Psychiatric/Behavioral: Negative for confusion.  All other systems reviewed and are negative.    Physical Exam Updated Vital Signs BP 134/89 (BP Location: Right Arm)   Pulse 87   Temp 97.6 F (36.4 C) (Oral)   Resp 19   SpO2 100%   Physical Exam Vitals signs and nursing note reviewed.  Constitutional:      General: She is not in acute distress.    Appearance: She is well-developed. She is not diaphoretic.  HENT:     Head: Normocephalic and atraumatic.     Mouth/Throat:     Mouth: Mucous membranes are moist.  Eyes:     Extraocular Movements: Extraocular movements intact.     Pupils: Pupils are equal, round, and reactive to light.  Neck:     Musculoskeletal: Normal range of motion and neck supple. No muscular tenderness.  Cardiovascular:     Rate and Rhythm: Normal rate and regular rhythm.     Pulses: Normal pulses.     Heart sounds: Normal heart sounds. No murmur.  Pulmonary:     Effort: Pulmonary effort is normal.     Breath sounds: Normal breath sounds.  Abdominal:     Tenderness: There is no abdominal tenderness.  Musculoskeletal:        General: No tenderness or signs of injury.     Right lower leg: Edema present.     Left lower leg: Edema present.  Skin:    General: Skin is warm and dry.     Findings: No erythema or rash.  Neurological:     General: No focal deficit present.     Mental Status: She is alert and oriented to person, place, and time.     Cranial Nerves: No cranial nerve deficit.     Sensory: No sensory deficit.     Motor: No weakness.  Psychiatric:        Behavior: Behavior normal.      ED Treatments / Results  Labs (all labs ordered are listed, but only abnormal results are displayed) Labs Reviewed  BASIC METABOLIC  PANEL - Abnormal; Notable for the following components:      Result Value   Potassium 3.2 (*)    CO2 21 (*)    Glucose, Bld 64 (*)    BUN <5 (*)    Calcium 8.3 (*)    All other components within normal limits  CBC WITH DIFFERENTIAL/PLATELET - Abnormal; Notable for the following components:   Hemoglobin 9.3 (*)    HCT 32.0 (*)    MCV 72.2 (*)    MCH 21.0 (*)    MCHC 29.1 (*)    RDW 17.6 (*)  All other components within normal limits    EKG EKG Interpretation  Date/Time:  Saturday November 28 2018 15:32:06 EST Ventricular Rate:  91 PR Interval:    QRS Duration: 88 QT Interval:  354 QTC Calculation: 436 R Axis:   45 Text Interpretation:  Sinus rhythm No old tracing to compare No acute changes Confirmed by Merrily Pew [phone]) on 11/28/2018 4:36:03 PM   Radiology Ct Head Wo Contrast  Result Date: 11/28/2018 CLINICAL DATA:  Syncopal episode today. Witnessed by pt's husband. Pt unsure if she hit her head. States she feels fine now. Hx: HTN EXAM: CT HEAD WITHOUT CONTRAST TECHNIQUE: Contiguous axial images were obtained from the base of the skull through the vertex without intravenous contrast. COMPARISON:  None. FINDINGS: Brain: No evidence of acute infarction, hemorrhage, hydrocephalus, extra-axial collection or mass lesion/mass effect. Vascular: No hyperdense vessel or unexpected calcification. Skull: Normal. Negative for fracture or focal lesion. Sinuses/Orbits: Visualized globes and orbits are within normal limits. The visualized sinuses and mastoid air cells are clear. Other: None. IMPRESSION: Normal unenhanced CT scan of the brain. Electronically Signed   By: Lajean Manes M.D.   On: 11/28/2018 14:47   Ct Angio Chest Pe W/cm &/or Wo Cm  Result Date: 11/28/2018 CLINICAL DATA:  28 year old female with acute syncope today. EXAM: CT ANGIOGRAPHY CHEST WITH CONTRAST TECHNIQUE: Multidetector CT imaging of the chest was performed using the standard protocol during bolus administration of  intravenous contrast. Multiplanar CT image reconstructions and MIPs were obtained to evaluate the vascular anatomy. CONTRAST:  44mL ISOVUE-370 IOPAMIDOL (ISOVUE-370) INJECTION 76% COMPARISON:  11/28/2018 and 09/28/2018 chest radiographs FINDINGS: Cardiovascular: This is a technically adequate study although respiratory and mild streak artifact in the LOWER lungs slightly decreases sensitivity. No pulmonary emboli are identified. Cardiomegaly noted. No thoracic aortic aneurysm or pericardial effusion. Mediastinum/Nodes: No enlarged mediastinal, hilar, or axillary lymph nodes. Thyroid gland, trachea, and esophagus demonstrate no significant findings. Lungs/Pleura: No airspace disease, consolidation, nodule, mass, pleural effusion or pneumothorax. Equivocal mild ground-glass opacities in a mosaic type pattern may represent small airway disease or less likely edema. Upper Abdomen: No acute abnormality. Gastric surgical changes noted. Musculoskeletal: No acute or suspicious bony abnormalities. Review of the MIP images confirms the above findings. IMPRESSION: 1. No evidence of pulmonary emboli. 2. Cardiomegaly 3. Equivocal mild ground-glass pulmonary opacities in a mosaic type pattern which may represent small airway or less likely edema. Electronically Signed   By: Margarette Canada M.D.   On: 11/28/2018 16:37   Dg Chest Port 1 View  Result Date: 11/28/2018 CLINICAL DATA:  Chest pain and shortness of breath. EXAM: PORTABLE CHEST 1 VIEW COMPARISON:  Chest x-ray dated September 28, 2018. FINDINGS: The heart size and mediastinal contours are within normal limits. Normal pulmonary vascularity. No focal consolidation, pleural effusion, or pneumothorax. IMPRESSION: No active disease. Electronically Signed   By: Titus Dubin M.D.   On: 11/28/2018 14:25    Procedures Procedures (including critical care time)  Medications Ordered in ED Medications  iopamidol (ISOVUE-370) 76 % injection (has no administration in time range)    sodium chloride 0.9 % bolus 1,000 mL (0 mLs Intravenous Stopped 11/28/18 1624)  iopamidol (ISOVUE-370) 76 % injection 75 mL (75 mLs Intravenous Contrast Given 11/28/18 1610)     Initial Impression / Assessment and Plan / ED Course  I have reviewed the triage vital signs and the nursing notes.  Pertinent labs & imaging results that were available during my care of the patient were reviewed by  me and considered in my medical decision making (see chart for details).  Clinical Course as of Nov 29 1711  Sat Nov 28, 4024  7427 28 year old female presents for evaluation after syncopal episode this morning with report of high blood pressure and high heart rate.  Patient denies any complaints other than the right side of her head feels sore where she thinks she may have hit her head on bedroom furniture.  On exam patient is tachycardic (mildly) at times ranging from upper 90s to low 100s on exam.  Patient has mild edema bilateral ankles which she states is new this morning as well.  Denies chest pain or respiratory complaints, states that she felt warm this morning before she stood up and had her syncopal episode.  CT of the head is unremarkable, chest x-ray normal, EKG unremarkable, CBC consistent with history of anemia, BMP with mild hypokalemia with potassium of 3.2.  CTA chest to evaluate for PE due to report of syncope with tachycardia, negative for PE.  Discussed results and plan of care with patient.  Patient continues to feel well and without complaint, her vitals have improved while in the emergency room.  Patient was given 1 L of IV fluids.  Patient to follow-up with her primary care provider, referral given if needed.  Patient return to ER for any further symptoms or any additional syncopal episodes.   [LM]    Clinical Course User Index [LM] Tacy Learn, PA-C   Final Clinical Impressions(s) / ED Diagnoses   Final diagnoses:  Syncope, unspecified syncope type  Bilateral leg edema    ED  Discharge Orders    None       Roque Lias 11/28/18 1713    Daleen Bo, MD 11/28/18 [phone]

## 2019-01-01 ENCOUNTER — Ambulatory Visit: Payer: Medicaid Other | Attending: Nurse Practitioner | Admitting: Nurse Practitioner

## 2019-01-01 ENCOUNTER — Encounter: Payer: Self-pay | Admitting: Nurse Practitioner

## 2019-01-01 VITALS — BP 131/94 | HR 102 | Temp 98.0°F | Ht 66.0 in | Wt 274.4 lb

## 2019-01-01 DIAGNOSIS — R000 Tachycardia, unspecified: Secondary | ICD-10-CM | POA: Diagnosis not present

## 2019-01-01 DIAGNOSIS — R102 Pelvic and perineal pain: Secondary | ICD-10-CM | POA: Insufficient documentation

## 2019-01-01 DIAGNOSIS — Z79899 Other long term (current) drug therapy: Secondary | ICD-10-CM | POA: Insufficient documentation

## 2019-01-01 DIAGNOSIS — J45909 Unspecified asthma, uncomplicated: Secondary | ICD-10-CM | POA: Diagnosis not present

## 2019-01-01 DIAGNOSIS — I10 Essential (primary) hypertension: Secondary | ICD-10-CM | POA: Insufficient documentation

## 2019-01-01 DIAGNOSIS — Z8249 Family history of ischemic heart disease and other diseases of the circulatory system: Secondary | ICD-10-CM | POA: Insufficient documentation

## 2019-01-01 DIAGNOSIS — E876 Hypokalemia: Secondary | ICD-10-CM | POA: Insufficient documentation

## 2019-01-01 DIAGNOSIS — Z7901 Long term (current) use of anticoagulants: Secondary | ICD-10-CM | POA: Insufficient documentation

## 2019-01-01 DIAGNOSIS — D508 Other iron deficiency anemias: Secondary | ICD-10-CM | POA: Insufficient documentation

## 2019-01-01 DIAGNOSIS — G8929 Other chronic pain: Secondary | ICD-10-CM | POA: Diagnosis not present

## 2019-01-01 DIAGNOSIS — R030 Elevated blood-pressure reading, without diagnosis of hypertension: Secondary | ICD-10-CM

## 2019-01-01 DIAGNOSIS — D509 Iron deficiency anemia, unspecified: Secondary | ICD-10-CM | POA: Insufficient documentation

## 2019-01-01 DIAGNOSIS — E282 Polycystic ovarian syndrome: Secondary | ICD-10-CM | POA: Insufficient documentation

## 2019-01-01 MED ORDER — AMLODIPINE BESYLATE 5 MG PO TABS: 5.0000 mg | ORAL_TABLET | Freq: Every day | ORAL | 3 refills | Status: DC

## 2019-01-01 NOTE — Progress Notes (Signed)
Assessment & Plan:  Peggy Andersen was seen today for new patient (initial visit).  Diagnoses and all orders for this visit:  Chronic pelvic pain in female -     Ambulatory referral to Gynecology -     US PELVIC COMPLETE WITH TRANSVAGINAL; Future  Elevated blood pressure reading -     TSH -     D-dimer, quantitative (not at [facility])  Hypokalemia -     CMP14+EGFR  Other iron deficiency anemia -     Ambulatory referral to Hematology  Tachycardia with heart rate 100-120 beats per minute -     D-dimer, quantitative (not at [facility])  Other orders -     amLODipine (NORVASC) 5 MG tablet; Take 1 tablet (5 mg total) by mouth daily.    Patient has been counseled on age-appropriate routine health concerns for screening and prevention. These are reviewed and up-to-date. Referrals have been placed accordingly. Immunizations are up-to-date or declined.    Subjective:   Chief Complaint  Patient presents with  . New Patient (Initial Visit)    Pt. stated she's been having a lot of pelvic pain, been going on for a month.    HPI Peggy Andersen 28 y.o. female presents to office today to establish care and  with complaints of chronic pelvic pain. She has a history of HTN and was previously prescribed amlodipine-benazapril however she denies ever taking any blood pressure medication in the past. Other PMH: lap gastric sleeve resection; PCOS and IDA.   Pelvic Pain: Patient complains of abdominal pain.Worsenining 1 month ago however has a history of PCOS and lower pelvic pain in the past.  She has experienced RLQ pain in the past however the pain gos away on its own. Some nausea and vomiiting. She denies any GU sypmtoms. The pain is described as aching, pressure-like and sharp, and is 6/10 in intensity. Pain is located in the RLQ without radiation. Onset was a few years ago. Symptoms have been waxing and waning since. Aggravating factors: none.  Alleviating factors: none. Associated symptoms: none.  The patient denies constipation, diarrhea, fever, hematochezia, hematuria and melena. She denies daily bowel movements but also denies constipation.  PCOS was diagnosed by gynecology several years ago.    Anemia She has a history of IDA and was seeing hematology in the past for iron infusions. No GI work up. States menstrual cycles are normal. No menorrhagia or metrorrhagia.  Denies chest pain, shortness of breath, palpitations, lightheadedness, dizziness, headaches.    Essential Hypertension Will start amlodipine '5mg'$  today and have patient return in 2-3 weeks for BP recheck. She denies chest pain, shortness of breath, palpitations, lightheadedness, dizziness, headaches visual disturbances or BLE edema.  BP Readings from Last 3 Encounters:  01/01/19 (!) 131/94  11/28/18 138/81  09/28/18 134/90    Review of Systems  Constitutional: Negative for fever, malaise/fatigue and weight loss.  HENT: Negative.  Negative for nosebleeds.   Eyes: Negative.  Negative for blurred vision, double vision and photophobia.  Respiratory: Negative.  Negative for cough and shortness of breath.   Cardiovascular: Negative.  Negative for chest pain, palpitations and leg swelling.  Gastrointestinal: Positive for abdominal pain. Negative for blood in stool, constipation, diarrhea, heartburn, melena, nausea and vomiting.  Musculoskeletal: Negative.  Negative for myalgias.  Neurological: Negative.  Negative for dizziness, focal weakness, seizures and headaches.  Psychiatric/Behavioral: Negative.  Negative for suicidal ideas.    Past Medical History:  Diagnosis Date  . Anemia   . Asthma   .  Hypertension   . PCOS (polycystic ovarian syndrome)     Past Surgical History:  Procedure Laterality Date  . LAPAROSCOPIC GASTRIC SLEEVE RESECTION    . TUBAL LIGATION      Family History  Problem Relation Age of Onset  . Stroke Mother   . Diabetes Mother   . Hypertension Mother     Social History Reviewed with no  changes to be made today.   Outpatient Medications Prior to Visit  Medication Sig Dispense Refill  . albuterol (VENTOLIN HFA) 108 (90 Base) MCG/ACT inhaler Inhale 2 puffs into the lungs every 6 (six) hours as needed for wheezing or shortness of breath.    . ferrous sulfate 325 (65 FE) MG tablet Take 325 mg by mouth 3 (three) times daily with meals.    Marland Kitchen omeprazole (PRILOSEC) 20 MG capsule Take 1 capsule (20 mg total) by mouth daily. 30 capsule 0  . vitamin B-12 (CYANOCOBALAMIN) 1000 MCG tablet Take 1,000 mcg by mouth daily.    Marland Kitchen oxyCODONE-acetaminophen (PERCOCET/ROXICET) 5-325 MG tablet Take 2 tablets by mouth every 4 (four) hours as needed for severe pain. (Patient not taking: Reported on 11/28/2018) 20 tablet 0   No facility-administered medications prior to visit.     Allergies  Allergen Reactions  . Nsaids     Pt. Stated she get severe stomach cramps .  Marland Kitchen Percocet [Oxycodone-Acetaminophen] Other (See Comments)    Severe stomach cramps       Objective:    BP (!) 131/94 (BP Location: Right Arm, Patient Position: Sitting, Cuff Size: Large)   Pulse (!) 102   Temp 98 F (36.7 C) (Oral)   Ht '5\' 6"'$  (1.676 m)   Wt 274 lb 6.4 oz (124.5 kg)   LMP 12/14/2018   SpO2 98%   BMI 44.29 kg/m  Wt Readings from Last 3 Encounters:  01/01/19 274 lb 6.4 oz (124.5 kg)  09/28/18 268 lb (121.6 kg)  04/29/18 270 lb (122.5 kg)    Physical Exam Vitals signs and nursing note reviewed.  Constitutional:      Appearance: She is well-developed.  HENT:     Head: Normocephalic and atraumatic.  Neck:     Musculoskeletal: Normal range of motion.  Cardiovascular:     Rate and Rhythm: Normal rate and regular rhythm.     Heart sounds: Normal heart sounds. No murmur. No friction rub. No gallop.   Pulmonary:     Effort: Pulmonary effort is normal. No tachypnea or respiratory distress.     Breath sounds: Normal breath sounds. No decreased breath sounds, wheezing, rhonchi or rales.  Chest:     Chest  wall: No tenderness.  Abdominal:     General: Abdomen is protuberant. Bowel sounds are normal.     Palpations: Abdomen is soft.     Tenderness: There is abdominal tenderness in the right lower quadrant. There is no guarding or rebound. Positive signs include McBurney's sign. Negative signs include Murphy's sign, Rovsing's sign, psoas sign and obturator sign.  Musculoskeletal: Normal range of motion.  Skin:    General: Skin is warm and dry.  Neurological:     Mental Status: She is alert and oriented to person, place, and time.     Coordination: Coordination normal.  Psychiatric:        Behavior: Behavior normal. Behavior is cooperative.        Thought Content: Thought content normal.        Judgment: Judgment normal.  Patient has been counseled extensively about nutrition and exercise as well as the importance of adherence with medications and regular follow-up. The patient was given clear instructions to go to ER or return to medical center if symptoms don't improve, worsen or new problems develop. The patient verbalized understanding.   Follow-up: Return in about 3 weeks (around 01/22/2019) for BP recheck and PAP.   Gildardo Pounds, FNP-BC [facility], [place]   01/01/2019, 7:26 PM

## 2019-01-02 LAB — CMP14+EGFR
ALT: 9 IU/L (ref 0–32)
AST: 11 IU/L (ref 0–40)
Albumin/Globulin Ratio: 1.6 (ref 1.2–2.2)
Albumin: 4.1 g/dL (ref 3.9–5.0)
Alkaline Phosphatase: 77 IU/L (ref 39–117)
BUN/Creatinine Ratio: 12 (ref 9–23)
BUN: 9 mg/dL (ref 6–20)
Bilirubin Total: <0.2 mg/dL (ref 0.0–1.2)
CO2: 21 mmol/L (ref 20–29)
Calcium: 8.7 mg/dL (ref 8.7–10.2)
Chloride: 107 mmol/L — ABNORMAL HIGH (ref 96–106)
Creatinine, Ser: 0.76 mg/dL (ref 0.57–1.00)
GFR calc Af Amer: 123 mL/min/{1.73_m2} (ref >59)
GFR calc non Af Amer: 107 mL/min/{1.73_m2} (ref >59)
GLUCOSE: 82 mg/dL (ref 65–99)
Globulin, Total: 2.5 g/dL (ref 1.5–4.5)
Potassium: 3.8 mmol/L (ref 3.5–5.2)
Sodium: 141 mmol/L (ref 134–144)
Total Protein: 6.6 g/dL (ref 6.0–8.5)

## 2019-01-02 LAB — TSH: TSH: 0.72 u[IU]/mL (ref 0.450–4.500)

## 2019-01-05 ENCOUNTER — Emergency Department (HOSPITAL_COMMUNITY): Payer: Medicaid Other

## 2019-01-05 ENCOUNTER — Other Ambulatory Visit: Payer: Self-pay

## 2019-01-05 ENCOUNTER — Ambulatory Visit (HOSPITAL_COMMUNITY)
Admission: RE | Admit: 2019-01-05 | Discharge: 2019-01-05 | Disposition: A | Discharge status: Home / Self Care | Payer: Medicaid Other | Source: Ambulatory Visit | Attending: Nurse Practitioner | Admitting: Nurse Practitioner

## 2019-01-05 ENCOUNTER — Emergency Department (HOSPITAL_COMMUNITY)
Admission: EM | Admit: 2019-01-05 | Discharge: 2019-01-06 | Disposition: A | Discharge status: Home / Self Care | Payer: Medicaid Other | Source: Home / Self Care | Attending: Emergency Medicine | Admitting: Emergency Medicine

## 2019-01-05 ENCOUNTER — Encounter (HOSPITAL_COMMUNITY): Payer: Self-pay | Admitting: Emergency Medicine

## 2019-01-05 DIAGNOSIS — I10 Essential (primary) hypertension: Secondary | ICD-10-CM | POA: Insufficient documentation

## 2019-01-05 DIAGNOSIS — Z7722 Contact with and (suspected) exposure to environmental tobacco smoke (acute) (chronic): Secondary | ICD-10-CM

## 2019-01-05 DIAGNOSIS — Z79899 Other long term (current) drug therapy: Secondary | ICD-10-CM | POA: Insufficient documentation

## 2019-01-05 DIAGNOSIS — J45909 Unspecified asthma, uncomplicated: Secondary | ICD-10-CM

## 2019-01-05 DIAGNOSIS — R102 Pelvic and perineal pain: Secondary | ICD-10-CM | POA: Insufficient documentation

## 2019-01-05 DIAGNOSIS — R2243 Localized swelling, mass and lump, lower limb, bilateral: Secondary | ICD-10-CM

## 2019-01-05 DIAGNOSIS — G8929 Other chronic pain: Secondary | ICD-10-CM | POA: Diagnosis present

## 2019-01-05 DIAGNOSIS — R002 Palpitations: Secondary | ICD-10-CM

## 2019-01-05 DIAGNOSIS — R42 Dizziness and giddiness: Secondary | ICD-10-CM

## 2019-01-05 LAB — CBC
HCT: 34.8 % — ABNORMAL LOW (ref 36.0–46.0)
Hemoglobin: 10.1 g/dL — ABNORMAL LOW (ref 12.0–15.0)
MCH: 20.3 pg — ABNORMAL LOW (ref 26.0–34.0)
MCHC: 29 g/dL — ABNORMAL LOW (ref 30.0–36.0)
MCV: 70 fL — ABNORMAL LOW (ref 80.0–100.0)
Platelets: 283 10*3/uL (ref 150–400)
RBC: 4.97 MIL/uL (ref 3.87–5.11)
RDW: 18.4 % — AB (ref 11.5–15.5)
WBC: 7.1 10*3/uL (ref 4.0–10.5)
nRBC: 0 % (ref 0.0–0.2)

## 2019-01-05 LAB — BASIC METABOLIC PANEL
Anion gap: 8 (ref 5–15)
BUN: 9 mg/dL (ref 6–20)
CO2: 17 mmol/L — ABNORMAL LOW (ref 22–32)
Calcium: 9.1 mg/dL (ref 8.9–10.3)
Chloride: 111 mmol/L (ref 98–111)
Creatinine, Ser: 0.78 mg/dL (ref 0.44–1.00)
GFR calc Af Amer: >60 mL/min (ref >60)
Glucose, Bld: 94 mg/dL (ref 70–99)
Potassium: 3.3 mmol/L — ABNORMAL LOW (ref 3.5–5.1)
SODIUM: 136 mmol/L (ref 135–145)

## 2019-01-05 LAB — I-STAT BETA HCG BLOOD, ED (MC, WL, AP ONLY): I-stat hCG, quantitative: <5 m[IU]/mL (ref <5)

## 2019-01-05 LAB — D-DIMER, QUANTITATIVE: D-Dimer, Quant: 0.55 ug/mL-FEU — ABNORMAL HIGH (ref 0.00–0.50)

## 2019-01-05 LAB — I-STAT TROPONIN, ED: Troponin i, poc: 0 ng/mL (ref 0.00–0.08)

## 2019-01-05 MED ORDER — SODIUM CHLORIDE 0.9% FLUSH
3.0000 mL | Freq: Once | INTRAVENOUS | Status: AC
  Administered 2019-01-05: 3 mL via INTRAVENOUS

## 2019-01-05 MED ORDER — IOHEXOL 350 MG/ML SOLN
100.0000 mL | Freq: Once | INTRAVENOUS | Status: AC | PRN
  Administered 2019-01-05: 100 mL via INTRAVENOUS

## 2019-01-05 MED ORDER — SODIUM CHLORIDE 0.9 % IV BOLUS
1000.0000 mL | Freq: Once | INTRAVENOUS | Status: AC
  Administered 2019-01-05: 1000 mL via INTRAVENOUS

## 2019-01-05 NOTE — ED Provider Notes (Signed)
[facility] EMERGENCY DEPARTMENT Provider Note   CSN: [id number] Arrival date & time: 01/05/19  1648    History   Chief Complaint Chief Complaint  Patient presents with  . Chest Pain  . Palpitations  . Dizziness    HPI Peggy Andersen is a 28 y.o. female with a PMH of Asthma, HTN, PCOS, and Anemia presenting with constant dizziness and palpitations onset today at 4pm. Patient describes dizziness as lightheadedness. Patient reports symptoms began while having a transvaginal ultrasound due to chronic pelvic pain. Patient denies shortness of breath, chest pain, nausea, vomiting, diarrhea, or fever. Patient denies cough or congestion. Patient reports traveling by car to [place] earlier this month. Patient reports bilateral leg edema onset today. Patient denies using OCPs, recent surgery, leg pain, or hx of DVT/PE.     HPI  Past Medical History:  Diagnosis Date  . Anemia   . Asthma   . Hypertension   . PCOS (polycystic ovarian syndrome)     Patient Active Problem List   Diagnosis Date Noted  . Chronic pelvic pain in female 01/01/2019  . Absolute anemia 01/01/2019    Past Surgical History:  Procedure Laterality Date  . LAPAROSCOPIC GASTRIC SLEEVE RESECTION    . TUBAL LIGATION       OB History   No obstetric history on file.      Home Medications    Prior to Admission medications   Medication Sig Start Date End Date Taking? Authorizing Provider  albuterol (VENTOLIN HFA) 108 (90 Base) MCG/ACT inhaler Inhale 2 puffs into the lungs every 6 (six) hours as needed for wheezing or shortness of breath.    [provider]  amLODipine (NORVASC) 5 MG tablet Take 1 tablet (5 mg total) by mouth daily. 01/01/19   Gildardo Pounds, NP  ferrous sulfate 325 (65 FE) MG tablet Take 325 mg by mouth 3 (three) times daily with meals.    [provider]  omeprazole (PRILOSEC) 20 MG capsule Take 1 capsule (20 mg total) by mouth daily. 09/28/18   Recardo Evangelist, PA-C  vitamin B-12 (CYANOCOBALAMIN) 1000 MCG tablet Take 1,000 mcg by mouth daily.    [provider]    Family History Family History  Problem Relation Age of Onset  . Stroke Mother   . Diabetes Mother   . Hypertension Mother     Social History Social History   Tobacco Use  . Smoking status: Passive Smoke Exposure - Never Smoker  . Smokeless tobacco: Never Used  Substance Use Topics  . Alcohol use: Never    Frequency: Never  . Drug use: Never     Allergies   Nsaids and Percocet [oxycodone-acetaminophen]   Review of Systems Review of Systems  Constitutional: Negative for activity change, appetite change, chills, diaphoresis, fatigue, fever and unexpected weight change.  HENT: Negative for congestion and rhinorrhea.   Eyes: Negative for visual disturbance.  Respiratory: Negative for cough, chest tightness, shortness of breath and wheezing.   Cardiovascular: Positive for palpitations and leg swelling. Negative for chest pain.  Gastrointestinal: Negative for abdominal pain, nausea and vomiting.  Endocrine: Negative for cold intolerance and heat intolerance.  Musculoskeletal: Negative for back pain.  Skin: Negative for rash.  Allergic/Immunologic: Negative for immunocompromised state.  Neurological: Positive for dizziness and light-headedness. Negative for syncope and weakness.  Psychiatric/Behavioral: Negative for agitation and behavioral problems. The patient is not nervous/anxious.      Physical Exam Updated Vital Signs BP (!) 92/54  Pulse (!) 107   Temp 98.2 F (36.8 C) (Oral)   Resp 19   Ht 5\' 6"  (1.676 m)   Wt 124.3 kg   LMP 12/14/2018   SpO2 100%   BMI 44.22 kg/m   Physical Exam Vitals signs and nursing note reviewed.  Constitutional:      General: She is not in acute distress.    Appearance: She is well-developed. She is not diaphoretic.  HENT:     Head: Normocephalic and atraumatic.  Eyes:     Extraocular Movements:  Extraocular movements intact.     Pupils: Pupils are equal, round, and reactive to light.  Neck:     Musculoskeletal: Normal range of motion and neck supple.  Cardiovascular:     Rate and Rhythm: Normal rate and regular rhythm.     Heart sounds: Normal heart sounds. No murmur. No friction rub. No gallop.   Pulmonary:     Effort: Pulmonary effort is normal. No respiratory distress.     Breath sounds: Normal breath sounds. No decreased breath sounds, wheezing, rhonchi or rales.  Chest:     Chest wall: No tenderness.  Abdominal:     Palpations: Abdomen is soft.  Musculoskeletal: Normal range of motion.     Right lower leg: She exhibits no tenderness. Edema present.     Left lower leg: She exhibits no tenderness. Edema present.  Skin:    General: Skin is warm.     Findings: No erythema or rash.  Neurological:     Mental Status: She is alert and oriented to person, place, and time.    Mental Status:  Alert, oriented, thought content appropriate, able to give a coherent history. Speech fluent without evidence of aphasia. Able to follow 2 step commands without difficulty.  Cranial Nerves:  II:  Peripheral visual fields grossly normal, pupils equal, round, reactive to light III,IV, VI: ptosis not present, extra-ocular motions intact bilaterally  V,VII: smile symmetric, facial light touch sensation equal VIII: hearing grossly normal to voice  X: uvula elevates symmetrically  XI: bilateral shoulder shrug symmetric and strong XII: midline tongue extension without fassiculations Motor:  Normal tone. 5/5 in upper and lower extremities bilaterally including strong and equal grip strength and dorsiflexion/plantar flexion Sensory: light touch normal in all extremities.  Deep Tendon Reflexes: 2+ and symmetric in the biceps and patella Cerebellar: normal finger-to-nose with bilateral upper extremities Gait: normal gait and balance without difficulty.  CV: distal pulses palpable throughout    ED Treatments / Results  Labs (all labs ordered are listed, but only abnormal results are displayed) Labs Reviewed  BASIC METABOLIC PANEL - Abnormal; Notable for the following components:      Result Value   Potassium 3.3 (*)    CO2 17 (*)    All other components within normal limits  CBC - Abnormal; Notable for the following components:   Hemoglobin 10.1 (*)    HCT 34.8 (*)    MCV 70.0 (*)    MCH 20.3 (*)    MCHC 29.0 (*)    RDW 18.4 (*)    All other components within normal limits  D-DIMER, QUANTITATIVE (NOT AT [facility]) - Abnormal; Notable for the following components:   D-Dimer, Quant 0.55 (*)    All other components within normal limits  I-STAT TROPONIN, ED  I-STAT BETA HCG BLOOD, ED ([facility], [facility], [facility] ONLY)    EKG None   ED ECG REPORT   Date: 01/05/2019   Rate: 103  Rhythm: sinus  tachycardia  QRS Axis: normal  Intervals: normal  ST/T Wave abnormalities: Borderline T wave abnormalities  Conduction Disutrbances: none  Narrative Interpretation: sinus tachycardia  Old EKG Reviewed: Slightly faster rate than previous EKG otherwise no changes.   Radiology Dg Chest 2 View  Result Date: 01/05/2019 CLINICAL DATA:  Initial evaluation for acute palpitations. EXAM: CHEST - 2 VIEW COMPARISON:  Prior radiograph from 11/28/2018. FINDINGS: The cardiac and mediastinal silhouettes are stable in size and contour, and remain within normal limits. The lungs are normally inflated. No airspace consolidation, pleural effusion, or pulmonary edema is identified. There is no pneumothorax. No acute osseous abnormality identified. IMPRESSION: No active cardiopulmonary disease. Electronically Signed   By: Jeannine Boga M.D.   On: 01/05/2019 18:37   Ct Angio Chest Pe W/cm &/or Wo Cm  Result Date: 01/05/2019 CLINICAL DATA:  28 year old female with tachycardia, palpitations. Abnormal D-dimer. EXAM: CT ANGIOGRAPHY CHEST WITH CONTRAST TECHNIQUE: Multidetector CT imaging of the chest was performed  using the standard protocol during bolus administration of intravenous contrast. Multiplanar CT image reconstructions and MIPs were obtained to evaluate the vascular anatomy. CONTRAST:  139 milliliters OMNIPAQUE IOHEXOL 350 MG/ML SOLN COMPARISON:  Chest radiographs earlier today. CTA chest 11/28/2018. FINDINGS: Cardiovascular: Poor contrast bolus timing in the pulmonary arterial tree despite a repeated attempt with a 2nd contrast dose (less than 150 Hounsfield units in the main PA both times). The central and proximal lower lobe pulmonary arteries are best evaluated and no filling defect is identified in those segments. Negative visible aorta. Stable cardiac size at the upper limits of normal. No pericardial effusion. Mediastinum/Nodes: Stable and negative. Lungs/Pleura: Major airways are patent. Similar low lung volumes. Minor pulmonary atelectasis but no pleural effusion or other abnormal pulmonary opacity. Upper Abdomen: Stable and negative; partially visible postoperative changes to the stomach. Musculoskeletal: Negative. Review of the MIP images confirms the above findings. IMPRESSION: 1. Poor contrast bolus timing despite two attempts. 2. No central or proximal lower lobe pulmonary embolus. If clinical suspicion persists recommend Nuclear Medicine V/Q scan. 3. Otherwise negative chest CTA. Electronically Signed   By: Genevie Ann M.D.   On: 01/05/2019 21:05   US Pelvic Complete With Transvaginal  Result Date: 01/05/2019 CLINICAL DATA:  Chronic pelvic pain EXAM: TRANSABDOMINAL AND TRANSVAGINAL ULTRASOUND OF PELVIS DOPPLER ULTRASOUND OF OVARIES TECHNIQUE: Both transabdominal and transvaginal ultrasound examinations of the pelvis were performed. Transabdominal technique was performed for global imaging of the pelvis including uterus, ovaries, adnexal regions, and pelvic cul-de-sac. It was necessary to proceed with endovaginal exam following the transabdominal exam to visualize the uterus endometrium ovaries. Color  and duplex Doppler ultrasound was utilized to evaluate blood flow to the ovaries. COMPARISON:  Ultrasound 04/29/2018 FINDINGS: Uterus Measurements: 8.1 x 5.3 x 5.5 cm = volume: 123.3 mL. Possible subtle iso to mildly hypoechoic anterior fundal mass measuring 2.7 x 2.5 x 2.1 cm. Endometrium Thickness: 13.6 mm.  No focal abnormality visualized. Right ovary Measurements: 2.8 x 1.9 x 2 cm = volume: 5.6 mL. Normal appearance/no adnexal mass. Left ovary Measurements: 4 x 2.6 x 3.1 cm = volume: 16.6 mL. Normal appearance/no adnexal mass. Pulsed Doppler evaluation of both ovaries demonstrates normal low-resistance arterial and venous waveforms. Other findings No abnormal free fluid. IMPRESSION: 1. Negative for ovarian torsion or suspicious ovarian mass lesion. 2. Possible anterior fundal fibroid. Electronically Signed   By: Donavan Foil M.D.   On: 01/05/2019 20:11    Procedures Procedures (including critical care time)  Medications Ordered in ED Medications  sodium chloride flush (NS) 0.9 % injection 3 mL (3 mLs Intravenous Given 01/05/19 1956)  sodium chloride 0.9 % bolus 1,000 mL (1,000 mLs Intravenous New Bag/Given 01/05/19 1957)  iohexol (OMNIPAQUE) 350 MG/ML injection 100 mL (100 mLs Intravenous Contrast Given 01/05/19 2013)     Initial Impression / Assessment and Plan / ED Course  I have reviewed the triage vital signs and the nursing notes.  Pertinent labs & imaging results that were available during my care of the patient were reviewed by me and considered in my medical decision making (see chart for details).  Clinical Course as of Jan 04 2114  Tue Jan 05, 2019  1744 Hemoglobin low at 10.1. This appears to have improved from baseline.   Hemoglobin(!): 10.1 [AH]  1844 No active cardiopulmonary disease noted on CXR.  DG Chest 2 View [AH]  [facility] 20.3 [AH]  2108 Poor contrast bolus timing despite two attempts. No central or proximal lower lobe pulmonary embolus. Otherwise negative chest  CTA.    CT Angio Chest PE W/Cm &/Or Wo Cm [AH]    Clinical Course User Index [AH] Arville Lime, PA-C      Patient presents with dizziness and palpitations. CXR does not reveal acute cardiopulmonary disease. Hemoglobin is low, but appears to be better than baseline. Patient had positive orthostatic vitals. Provided IVF while in the ER. Ordered CTA due to tachycardia and symptoms. CTA is negative. Findings and plan of care discussed with supervising physician Dr. Ronnald Nian. IVF are pending. Dr. Ronnald Nian re-evaluate and discharge patient once IVF are finished.   Final Clinical Impressions(s) / ED Diagnoses   Final diagnoses:  Dizziness  Palpitations    ED Discharge Orders    None       Arville Lime, PA-C 01/05/19 2118    Lennice Sites, DO 01/05/19 [place], [place], DO 01/05/19 2222

## 2019-01-05 NOTE — ED Notes (Signed)
Pt ambulatory to bathroom without any problems 

## 2019-01-05 NOTE — ED Notes (Signed)
ED Provider at bedside. 

## 2019-01-05 NOTE — ED Triage Notes (Signed)
Pt. Stated, I had an appt upstairs for [place] and then my heart was racing and I felt really dizzy. I came down here.

## 2019-01-05 NOTE — Discharge Instructions (Addendum)
You have been seen today for palpitations and dizziness. Please read and follow all provided instructions.   1. Medications: usual home medications 2. Treatment: rest, drink plenty of fluids, stay hydrated 3. Follow Up: Please follow up with your primary doctor in 2 days for discussion of your diagnoses and further evaluation after today's visit; if you do not have a primary care doctor use the resource guide provided to find one; Please return to the ER for any new or worsening symptoms. Please obtain all of your results from medical records or have your doctors office obtain the results - share them with your doctor - you should be seen at your doctors office. Call today to arrange your follow up.   Take medications as prescribed. Please review all of the medicines and only take them if you do not have an allergy to them. Return to the emergency room for worsening condition or new concerning symptoms. Follow up with your regular doctor. If you don't have a regular doctor use one of the numbers below to establish a primary care doctor.  Please be aware that if you are taking birth control pills, taking other prescriptions, ESPECIALLY ANTIBIOTICS may make the birth control ineffective - if this is the case, either do not engage in sexual activity or use alternative methods of birth control such as condoms until you have finished the medicine and your family doctor says it is OK to restart them. If you are on a blood thinner such as COUMADIN, be aware that any other medicine that you take may cause the coumadin to either work too much, or not enough - you should have your coumadin level rechecked in next 7 days if this is the case.  ?  It is also a possibility that you have an allergic reaction to any of the medicines that you have been prescribed - Everybody reacts differently to medications and while MOST people have no trouble with most medicines, you may have a reaction such as nausea, vomiting, rash,  swelling, shortness of breath. If this is the case, please stop taking the medicine immediately and contact your physician.  ?  You should return to the ER if you develop severe or worsening symptoms.   Emergency Department Resource Guide 1) Find a Doctor and Pay Out of Pocket Although you won't have to find out who is covered by your insurance plan, it is a good idea to ask around and get recommendations. You will then need to call the office and see if the doctor you have chosen will accept you as a new patient and what types of options they offer for patients who are self-pay. Some doctors offer discounts or will set up payment plans for their patients who do not have insurance, but you will need to ask so you aren't surprised when you get to your appointment.  2) Contact Your Local Health Department Not all health departments have doctors that can see patients for sick visits, but many do, so it is worth a call to see if yours does. If you don't know where your local health department is, you can check in your phone book. The CDC also has a tool to help you locate your state's health department, and many state websites also have listings of all of their local health departments.  3) Find a [facility] If your illness is not likely to be very severe or complicated, you may want to try a walk in clinic. These are popping  up all over the country in pharmacies, drugstores, and shopping centers. They're usually staffed by nurse practitioners or physician assistants that have been trained to treat common illnesses and complaints. They're usually fairly quick and inexpensive. However, if you have serious medical issues or chronic medical problems, these are probably not your best option.  No Primary Care Doctor: Call Health Connect at  [phone] - they can help you locate a primary care doctor that  accepts your insurance, provides certain services, etc. Physician Referral [phone]  Emergency Department Resource Guide 1) Find a Doctor and Pay Out of Pocket Although you won't have to find out who is covered by your insurance plan, it is a good idea to ask around and get recommendations. You will then need to call the office and see if the doctor you have chosen will accept you as a new patient and what types of options they offer for patients who are self-pay. Some doctors offer discounts or will set up payment plans for their patients who do not have insurance, but you will need to ask so you aren't surprised when you get to your appointment.  2) Contact Your Local Health Department Not all health departments have doctors that can see patients for sick visits, but many do, so it is worth a call to see if yours does. If you don't know where your local health department is, you can check in your phone book. The CDC also has a tool to help you locate your state's health department, and many state websites also have listings of all of their local health departments.  3) Find a [facility] If your illness is not likely to be very severe or complicated, you may want to try a walk in clinic. These are popping up all over the country in pharmacies, drugstores, and shopping centers. They're usually staffed by nurse practitioners or physician assistants that have been trained to treat common illnesses and complaints. They're usually fairly quick and inexpensive. However, if you have serious medical issues or chronic medical problems, these are probably not your best option.  No Primary Care Doctor: Call Health Connect at  [phone] - they can help you locate a primary care doctor that  accepts your insurance, provides certain services, etc. Physician Referral Service- [phone]  Chronic Pain Problems: Organization         Address  Phone   Notes  [facility]  [phone] Patients need to be referred by their primary care doctor.    Medication Assistance: Organization         Address  Phone   Notes  [facility] Medication [facility] [address] [place], [place] [address] [phone] --Must be a resident of [facility] -- Must have NO insurance coverage whatsoever (no Medicaid/ Medicare, etc.) -- The pt. MUST have a primary care doctor that directs their care regularly and follows them in the community   [facility]  [phone]   [employer]  [phone]    Agencies that provide inexpensive medical care: Organization         Address  Phone   Notes  [place]  [phone]   [facility]    [phone]   [facility] [place], [place] [address] [phone]   [place] [address], [place] [phone]   [facility]    [phone]  [facility]    [phone]   [facility]  [address], [place] Phone:  [phone], Fax:  [phone] Hours of Operation:  9 am - 6 pm, M-F.  Also accepts Medicaid/Medicare and self-pay.  [facility] for [place] [address], [address], [place] Phone: [phone], Fax: [phone]. Hours of Operation:  8:30 am - 5:30 pm, M-F.  Also accepts Medicaid and self-pay.  [facility] [place] [address] [place] Phone: [phone]   [place], [place], [place] [phone], Ext. [phone] Mondays & Thursdays: 7-9 AM.  First 15 patients are seen on a first come, first serve basis.    [employer] Providers:  Organization         Address  Phone   Notes  [facility] [address], [place] [phone] Also accepts self-pay patients.  [facility] [address], [place]  [phone]   [place], [address], [place] [phone]   [facility] [address], [place] [phone]   Lucianne Lei [address], [place]   [phone] Only accepts [place] patients after they have their name applied to their card.   Self-Pay (no insurance) in [facility]:  Organization         Address  Phone   Notes  Sickle Cell Patients, [facility] [place] [phone]   [facility] [place] [phone]   [facility] [place]  [place], [place], [place] [phone]   [facility]/Dr. Osei-Bonsu  [address], [place] or [address], [place] [phone] Phone number for both [place] and [place] locations is the same.  [facility] [address] [place] [phone]   [facility] [address], [place] or [address] [phone]   [facility] [address], [place] [phone], phone; [phone], fax Sees patients 1st and 3rd Saturday of every month.  Must not qualify for public or private insurance (i.e. Medicaid, Medicare, [place] Health Choice, Veterans' Benefits)  Household income should be no more than 200% of the poverty level The clinic cannot treat you if you are pregnant or think you are pregnant  Sexually transmitted diseases are not treated at the clinic.

## 2019-01-07 ENCOUNTER — Emergency Department (HOSPITAL_COMMUNITY): Payer: Medicaid Other

## 2019-01-07 ENCOUNTER — Inpatient Hospital Stay (HOSPITAL_COMMUNITY)
Admission: EM | Admit: 2019-01-07 | Discharge: 2019-01-09 | DRG: 309 | Disposition: A | Discharge status: Home / Self Care | Payer: Medicaid Other | Attending: Internal Medicine | Admitting: Internal Medicine

## 2019-01-07 ENCOUNTER — Other Ambulatory Visit: Payer: Self-pay

## 2019-01-07 ENCOUNTER — Encounter (HOSPITAL_COMMUNITY): Payer: Self-pay | Admitting: Emergency Medicine

## 2019-01-07 DIAGNOSIS — Z6841 Body Mass Index (BMI) 40.0 and over, adult: Secondary | ICD-10-CM | POA: Diagnosis not present

## 2019-01-07 DIAGNOSIS — J45909 Unspecified asthma, uncomplicated: Secondary | ICD-10-CM | POA: Diagnosis present

## 2019-01-07 DIAGNOSIS — R0602 Shortness of breath: Secondary | ICD-10-CM | POA: Diagnosis not present

## 2019-01-07 DIAGNOSIS — R102 Pelvic and perineal pain: Secondary | ICD-10-CM | POA: Diagnosis present

## 2019-01-07 DIAGNOSIS — R000 Tachycardia, unspecified: Secondary | ICD-10-CM | POA: Diagnosis present

## 2019-01-07 DIAGNOSIS — G8929 Other chronic pain: Secondary | ICD-10-CM | POA: Diagnosis present

## 2019-01-07 DIAGNOSIS — Z833 Family history of diabetes mellitus: Secondary | ICD-10-CM

## 2019-01-07 DIAGNOSIS — I498 Other specified cardiac arrhythmias: Principal | ICD-10-CM | POA: Diagnosis present

## 2019-01-07 DIAGNOSIS — D509 Iron deficiency anemia, unspecified: Secondary | ICD-10-CM | POA: Diagnosis present

## 2019-01-07 DIAGNOSIS — I10 Essential (primary) hypertension: Secondary | ICD-10-CM | POA: Diagnosis present

## 2019-01-07 DIAGNOSIS — K219 Gastro-esophageal reflux disease without esophagitis: Secondary | ICD-10-CM | POA: Diagnosis present

## 2019-01-07 DIAGNOSIS — Z823 Family history of stroke: Secondary | ICD-10-CM

## 2019-01-07 DIAGNOSIS — Z8249 Family history of ischemic heart disease and other diseases of the circulatory system: Secondary | ICD-10-CM

## 2019-01-07 DIAGNOSIS — R002 Palpitations: Secondary | ICD-10-CM | POA: Diagnosis present

## 2019-01-07 DIAGNOSIS — E669 Obesity, unspecified: Secondary | ICD-10-CM | POA: Diagnosis present

## 2019-01-07 LAB — BASIC METABOLIC PANEL
Anion gap: 6 (ref 5–15)
BUN: 7 mg/dL (ref 6–20)
CO2: 19 mmol/L — ABNORMAL LOW (ref 22–32)
Calcium: 8.9 mg/dL (ref 8.9–10.3)
Chloride: 111 mmol/L (ref 98–111)
Creatinine, Ser: 0.73 mg/dL (ref 0.44–1.00)
GFR calc Af Amer: >60 mL/min (ref >60)
GFR calc non Af Amer: >60 mL/min (ref >60)
Glucose, Bld: 98 mg/dL (ref 70–99)
Potassium: 3.7 mmol/L (ref 3.5–5.1)
SODIUM: 136 mmol/L (ref 135–145)

## 2019-01-07 LAB — CBC
HCT: 35.5 % — ABNORMAL LOW (ref 36.0–46.0)
Hemoglobin: 10.2 g/dL — ABNORMAL LOW (ref 12.0–15.0)
MCH: 20.1 pg — ABNORMAL LOW (ref 26.0–34.0)
MCHC: 28.7 g/dL — ABNORMAL LOW (ref 30.0–36.0)
MCV: 70 fL — ABNORMAL LOW (ref 80.0–100.0)
Platelets: 322 10*3/uL (ref 150–400)
RBC: 5.07 MIL/uL (ref 3.87–5.11)
RDW: 19 % — ABNORMAL HIGH (ref 11.5–15.5)
WBC: 5.9 10*3/uL (ref 4.0–10.5)
nRBC: 0 % (ref 0.0–0.2)

## 2019-01-07 LAB — I-STAT TROPONIN, ED: Troponin i, poc: 0 ng/mL (ref 0.00–0.08)

## 2019-01-07 LAB — I-STAT BETA HCG BLOOD, ED (MC, WL, AP ONLY): I-stat hCG, quantitative: <5 m[IU]/mL (ref <5)

## 2019-01-07 MED ORDER — FERROUS SULFATE 325 (65 FE) MG PO TABS
325.0000 mg | ORAL_TABLET | Freq: Three times a day (TID) | ORAL | Status: DC
  Administered 2019-01-08 – 2019-01-09 (×4): 325 mg via ORAL
  Filled 2019-01-07 (×4): qty 1

## 2019-01-07 MED ORDER — VITAMIN B-12 1000 MCG PO TABS
1000.0000 ug | ORAL_TABLET | Freq: Two times a day (BID) | ORAL | Status: DC
  Administered 2019-01-08 – 2019-01-09 (×4): 1000 ug via ORAL
  Filled 2019-01-07 (×4): qty 1

## 2019-01-07 MED ORDER — DIPHENHYDRAMINE HCL 50 MG/ML IJ SOLN
12.5000 mg | Freq: Once | INTRAMUSCULAR | Status: AC
  Administered 2019-01-07: 12.5 mg via INTRAVENOUS
  Filled 2019-01-07: qty 1

## 2019-01-07 MED ORDER — METOPROLOL TARTRATE 50 MG PO TABS
50.0000 mg | ORAL_TABLET | Freq: Two times a day (BID) | ORAL | Status: DC
  Administered 2019-01-08 – 2019-01-09 (×4): 50 mg via ORAL
  Filled 2019-01-07 (×4): qty 1

## 2019-01-07 MED ORDER — PANTOPRAZOLE SODIUM 40 MG PO TBEC
40.0000 mg | DELAYED_RELEASE_TABLET | Freq: Every day | ORAL | Status: DC
  Administered 2019-01-08 – 2019-01-09 (×2): 40 mg via ORAL
  Filled 2019-01-07 (×2): qty 1

## 2019-01-07 MED ORDER — FAMOTIDINE IN NACL 20-0.9 MG/50ML-% IV SOLN
20.0000 mg | Freq: Once | INTRAVENOUS | Status: AC
  Administered 2019-01-07: 20 mg via INTRAVENOUS
  Filled 2019-01-07: qty 50

## 2019-01-07 MED ORDER — ALUM & MAG HYDROXIDE-SIMETH 200-200-20 MG/5ML PO SUSP
30.0000 mL | Freq: Once | ORAL | Status: AC
  Administered 2019-01-07: 30 mL via ORAL
  Filled 2019-01-07: qty 30

## 2019-01-07 MED ORDER — ACETAMINOPHEN 325 MG PO TABS
650.0000 mg | ORAL_TABLET | Freq: Once | ORAL | Status: AC
  Administered 2019-01-07: 650 mg via ORAL
  Filled 2019-01-07: qty 2

## 2019-01-07 MED ORDER — METOCLOPRAMIDE HCL 5 MG/ML IJ SOLN
5.0000 mg | Freq: Once | INTRAMUSCULAR | Status: AC
  Administered 2019-01-07: 5 mg via INTRAVENOUS
  Filled 2019-01-07: qty 2

## 2019-01-07 MED ORDER — SODIUM CHLORIDE 0.9 % IV SOLN
INTRAVENOUS | Status: DC
  Administered 2019-01-08 – 2019-01-09 (×5): via INTRAVENOUS

## 2019-01-07 MED ORDER — SODIUM CHLORIDE 0.9 % IV BOLUS
1000.0000 mL | Freq: Once | INTRAVENOUS | Status: AC
  Administered 2019-01-07: 1000 mL via INTRAVENOUS

## 2019-01-07 MED ORDER — ALBUTEROL SULFATE (2.5 MG/3ML) 0.083% IN NEBU: 2.5000 mg | INHALATION_SOLUTION | Freq: Four times a day (QID) | RESPIRATORY_TRACT | Status: DC | PRN

## 2019-01-07 MED ORDER — ONDANSETRON HCL 4 MG PO TABS: 4.0000 mg | ORAL_TABLET | Freq: Four times a day (QID) | ORAL | Status: DC | PRN

## 2019-01-07 MED ORDER — ONDANSETRON HCL 4 MG/2ML IJ SOLN: 4.0000 mg | Freq: Four times a day (QID) | INTRAMUSCULAR | Status: DC | PRN

## 2019-01-07 MED ORDER — SODIUM CHLORIDE 0.9% FLUSH: 3.0000 mL | Freq: Once | INTRAVENOUS | Status: DC

## 2019-01-07 NOTE — ED Notes (Signed)
Report attempted 

## 2019-01-07 NOTE — ED Notes (Signed)
ED TO INPATIENT HANDOFF REPORT  ED Nurse Name and Phone #: Benjamine Mola [phone]  S Name/Age/Gender Peggy Andersen 28 y.o. female Room/Bed: 042C/042C  Code Status   Code Status: Not on file  Home/SNF/Other Home Patient oriented to: self, place, time and situation Is this baseline? Yes   Triage Complete: Triage complete  Chief Complaint Heart burn n/v  Triage Note Pt reports she was here 2 days ago for tachycardia, a headache, and heartburn. Pt reports she still is not getting any better.    Allergies Allergies  Allergen Reactions  . Nsaids Other (See Comments)    Pt. Stated she get severe stomach cramps .  Marland Kitchen Percocet [Oxycodone-Acetaminophen] Other (See Comments)    Severe stomach cramps    Level of Care/Admitting Diagnosis ED Disposition    ED Disposition Condition [facility] Area: [place] [[id number]]  Level of Care: Telemetry Cardiac [103]  Diagnosis: Rapid palpitations [875643]  Admitting Physician: Elwyn Reach [[id number]]  Attending Physician: Elwyn Reach [[id number]]  Estimated length of stay: past midnight tomorrow  Certification:: I certify this patient will need inpatient services for at least 2 midnights  PT Class (Do Not Modify): Inpatient [101]  PT Acc Code (Do Not Modify): Private [1]       B Medical/Surgery History Past Medical History:  Diagnosis Date  . Anemia   . Asthma   . Hypertension   . PCOS (polycystic ovarian syndrome)    Past Surgical History:  Procedure Laterality Date  . LAPAROSCOPIC GASTRIC SLEEVE RESECTION    . TUBAL LIGATION       A IV Location/Drains/Wounds Patient Lines/Drains/Airways Status   Active Line/Drains/Airways    Name:   Placement date:   Placement time:   Site:   Days:   Peripheral IV 01/07/19 Left Antecubital   01/07/19    1934    Antecubital   less than 1          Intake/Output Last 24 hours  Intake/Output Summary (Last 24 hours) at 01/07/2019 2204 Last  data filed at 01/07/2019 2144 Gross per 24 hour  Intake 1045.26 ml  Output -  Net 1045.26 ml    Labs/Imaging Results for orders placed or performed during the hospital encounter of 01/07/19 (from the past 48 hour(s))  Basic metabolic panel     Status: Abnormal   Collection Time: 01/07/19  1:28 PM  Result Value Ref Range   Sodium 136 135 - 145 mmol/L   Potassium 3.7 3.5 - 5.1 mmol/L   Chloride 111 98 - 111 mmol/L   CO2 19 (L) 22 - 32 mmol/L   Glucose, Bld 98 70 - 99 mg/dL   BUN 7 6 - 20 mg/dL   Creatinine, Ser 0.73 0.44 - 1.00 mg/dL   Calcium 8.9 8.9 - 10.3 mg/dL   GFR calc non Af Amer >60 >60 mL/min   GFR calc Af Amer >60 >60 mL/min   Anion gap 6 5 - 15    Comment: Performed at [facility], [place] [address], [place], [place] [address]  CBC     Status: Abnormal   Collection Time: 01/07/19  1:28 PM  Result Value Ref Range   WBC 5.9 4.0 - 10.5 K/uL   RBC 5.07 3.87 - 5.11 MIL/uL   Hemoglobin 10.2 (L) 12.0 - 15.0 g/dL   HCT 35.5 (L) 36.0 - 46.0 %   MCV 70.0 (L) 80.0 - 100.0 fL   MCH 20.1 (L) 26.0 -  34.0 pg   MCHC 28.7 (L) 30.0 - 36.0 g/dL   RDW 19.0 (H) 11.5 - 15.5 %   Platelets 322 150 - 400 K/uL   nRBC 0.0 0.0 - 0.2 %    Comment: Performed at [facility], [place] [address] [place], [place] [address]  I-stat troponin, ED     Status: None   Collection Time: 01/07/19  1:52 PM  Result Value Ref Range   Troponin i, poc 0.00 0.00 - 0.08 ng/mL   Comment 3            Comment: Due to the release kinetics of cTnI, a negative result within the first hours of the onset of symptoms does not rule out myocardial infarction with certainty. If myocardial infarction is still suspected, repeat the test at appropriate intervals.   I-Stat beta hCG blood, ED     Status: None   Collection Time: 01/07/19  2:04 PM  Result Value Ref Range   I-stat hCG, quantitative <5.0 <5 mIU/mL   Comment 3            Comment:   GEST. AGE      CONC.  (mIU/mL)   <=1 WEEK        5 - 50     2 WEEKS        50 - 500     3 WEEKS       100 - 10,000     4 WEEKS     1,000 - 30,000        FEMALE AND NON-PREGNANT FEMALE:     LESS THAN 5 mIU/mL    Dg Chest 2 View  Result Date: 01/07/2019 CLINICAL DATA:  Tachycardia. EXAM: CHEST - 2 VIEW COMPARISON:  CT scan and radiographs of January 05, 2019. FINDINGS: The heart size and mediastinal contours are within normal limits. Both lungs are clear. No pneumothorax or pleural effusion is noted. The visualized skeletal structures are unremarkable. IMPRESSION: No active cardiopulmonary disease. Electronically Signed   By: Marijo Conception, M.D.   On: 01/07/2019 13:43    Pending Labs [employer] (From admission, onward)    Start     Ordered   Signed and Held  Comprehensive metabolic panel  Tomorrow morning,   R     Signed and Held   Signed and Held  CBC  Tomorrow morning,   R     Signed and Held          Vitals/Pain Today's Vitals   01/07/19 2000 01/07/19 2030 01/07/19 2052 01/07/19 2200  BP: 123/86 140/84  131/86  Pulse: 95 (!) 101  (!) 101  Resp:  18  17  Temp:      TempSrc:      SpO2: 99% 100%  100%  Weight:      Height:      PainSc:   0-No pain     Isolation Precautions No active isolations  Medications Medications  sodium chloride flush (NS) 0.9 % injection 3 mL (has no administration in time range)  sodium chloride 0.9 % bolus 1,000 mL (0 mLs Intravenous Stopped 01/07/19 2144)  acetaminophen (TYLENOL) tablet 650 mg (650 mg Oral Given 01/07/19 1923)  metoCLOPramide (REGLAN) injection 5 mg (5 mg Intravenous Given 01/07/19 1935)  diphenhydrAMINE (BENADRYL) injection 12.5 mg (12.5 mg Intravenous Given 01/07/19 1936)  alum & mag hydroxide-simeth (MAALOX/MYLANTA) 200-200-20 MG/5ML suspension 30 mL (30 mLs Oral Given 01/07/19 2049)  famotidine (PEPCID) IVPB 20 mg premix (0  mg Intravenous Stopped 01/07/19 2121)    Mobility walks Low fall risk   Focused Assessments Cardiac Assessment Handoff:  Cardiac Rhythm: Sinus tachycardia No  results found for: CKTOTAL, CKMB, CKMBINDEX, TROPONINI Lab Results  Component Value Date   DDIMER 0.55 (H) 01/05/2019   Does the Patient currently have chest pain? No     R Recommendations: See Admitting Provider Note  Report given to:   Additional Notes:

## 2019-01-07 NOTE — H&P (Signed)
History and Physical   Peggy Andersen YQM:[id number] DOB: 07-22-1991 DOA: 01/07/2019  Referring MD/NP/PA: Dr. Daleen Bo  PCP: Patient, No Pcp Per   Outpatient Specialists: None  Patient coming from: Home  Chief Complaint: Palpitations  HPI: Peggy Andersen is a 28 y.o. female with medical history significant of hypertension, Chronic iron deficiency anemia, PCOS, asthma headache and GERD who came in to the ER about 2 days ago after she went to her doctor and was told she had palpitations.  She was having heart rate in the 130s to 140s.  Suspected PE at the time with CT angiogram negative.  TSH was checked and 0.7 within normal range.  She did have swelling of her lower extremity at the time which is new.  Patient has had no prior cardiac history and no significant family history except for valvular disease in her mother.  Patient was treated and sent home.  She has been on amlodipine which was thought to be the cause of her lower extremity edema.  She came back today because of worsening palpitations.  Her heart rate goes as high as 150s to 160s with activity.  Her resting heart rate is around 100.  Patient was orthostatic 2 days ago but not today.  Continues to be lightheaded with activities.  She is being admitted for work-up of this significant palpitations with sinus tachycardia.  ED Course: Blood pressure is 160/93, temperature 98.8, pulse 131, respiratory 23 and oxygen sat 96% on room air.  Chemistry so far negative.  Hemoglobin 10.2 otherwise CBC normal.  Chest x-ray showed no active disease.  EKG showed sinus tachycardia with a rate of 120.  Mildly flattened T waves in the lateral leads.  Review of Systems: As per HPI otherwise 10 point review of systems negative.    Past Medical History:  Diagnosis Date  . Anemia   . Asthma   . Hypertension   . PCOS (polycystic ovarian syndrome)     Past Surgical History:  Procedure Laterality Date  . LAPAROSCOPIC GASTRIC  SLEEVE RESECTION    . TUBAL LIGATION       reports that she is a non-smoker but has been exposed to tobacco smoke. She has never used smokeless tobacco. She reports that she does not drink alcohol or use drugs.  Allergies  Allergen Reactions  . Nsaids Other (See Comments)    Pt. Stated she get severe stomach cramps .  Marland Kitchen Percocet [Oxycodone-Acetaminophen] Other (See Comments)    Severe stomach cramps    Family History  Problem Relation Age of Onset  . Stroke Mother   . Diabetes Mother   . Hypertension Mother      Prior to Admission medications   Medication Sig Start Date End Date Taking? Authorizing Provider  albuterol (VENTOLIN HFA) 108 (90 Base) MCG/ACT inhaler Inhale 2 puffs into the lungs every 6 (six) hours as needed for wheezing or shortness of breath.   Yes [provider]  amLODipine (NORVASC) 5 MG tablet Take 1 tablet (5 mg total) by mouth daily. 01/01/19  Yes Gildardo Pounds, NP  ferrous sulfate 325 (65 FE) MG tablet Take 325 mg by mouth 3 (three) times daily with meals.   Yes [provider]  omeprazole (PRILOSEC) 20 MG capsule Take 1 capsule (20 mg total) by mouth daily. 09/28/18  Yes Recardo Evangelist, PA-C  vitamin B-12 (CYANOCOBALAMIN) 1000 MCG tablet Take 1,000 mcg by mouth 2 (two) times daily.    Yes [provider]  Physical Exam: Vitals:   01/07/19 2000 01/07/19 2030 01/07/19 2200 01/07/19 2230  BP: 123/86 140/84 131/86 123/74  Pulse: 95 (!) 101 (!) 101 (!) 104  Resp:  18 17 (!) 21  Temp:      TempSrc:      SpO2: 99% 100% 100% 99%  Weight:      Height:          Constitutional: NAD, calm, comfortable Vitals:   01/07/19 2000 01/07/19 2030 01/07/19 2200 01/07/19 2230  BP: 123/86 140/84 131/86 123/74  Pulse: 95 (!) 101 (!) 101 (!) 104  Resp:  18 17 (!) 21  Temp:      TempSrc:      SpO2: 99% 100% 100% 99%  Weight:      Height:       Eyes: PERRL, lids and conjunctivae normal ENMT: Mucous membranes are moist. Posterior  pharynx clear of any exudate or lesions.Normal dentition.  Neck: normal, supple, no masses, no thyromegaly Respiratory: clear to auscultation bilaterally, no wheezing, no crackles. Normal respiratory effort. No accessory muscle use.  Cardiovascular: Sinus tachycardia, no murmurs / rubs / gallops. No extremity edema. 2+ pedal pulses. No carotid bruits.  Abdomen: no tenderness, no masses palpated. No hepatosplenomegaly. Bowel sounds positive.  Musculoskeletal: no clubbing / cyanosis. No joint deformity upper and lower extremities. Good ROM, no contractures. Normal muscle tone.  Skin: no rashes, lesions, ulcers. No induration Neurologic: CN 2-12 grossly intact. Sensation intact, DTR normal. Strength 5/5 in all 4.  Psychiatric: Normal judgment and insight. Alert and oriented x 3. Normal mood.     Labs on Admission: I have personally reviewed following labs and imaging studies  CBC: Recent Labs  Lab 01/05/19 1721 01/07/19 1328  WBC 7.1 5.9  HGB 10.1* 10.2*  HCT 34.8* 35.5*  MCV 70.0* 70.0*  PLT 283 099   Basic Metabolic Panel: Recent Labs  Lab 01/01/19 1734 01/05/19 1721 01/07/19 1328  NA 141 136 136  K 3.8 3.3* 3.7  CL 107* 111 111  CO2 21 17* 19*  GLUCOSE 82 94 98  BUN 9 9 7   CREATININE 0.76 0.78 0.73  CALCIUM 8.7 9.1 8.9   GFR: Estimated Creatinine Clearance: 141 mL/min (by C-G formula based on SCr of 0.73 mg/dL). Liver Function Tests: Recent Labs  Lab 01/01/19 1734  AST 11  ALT 9  ALKPHOS 77  BILITOT <0.2  PROT 6.6  ALBUMIN 4.1   No results for input(s): LIPASE, AMYLASE in the last 168 hours. No results for input(s): AMMONIA in the last 168 hours. Coagulation Profile: No results for input(s): INR, PROTIME in the last 168 hours. Cardiac Enzymes: No results for input(s): CKTOTAL, CKMB, CKMBINDEX, TROPONINI in the last 168 hours. BNP (last 3 results) No results for input(s): PROBNP in the last 8760 hours. HbA1C: No results for input(s): HGBA1C in the last 72  hours. CBG: No results for input(s): GLUCAP in the last 168 hours. Lipid Profile: No results for input(s): CHOL, HDL, LDLCALC, TRIG, CHOLHDL, LDLDIRECT in the last 72 hours. Thyroid Function Tests: No results for input(s): TSH, T4TOTAL, FREET4, T3FREE, THYROIDAB in the last 72 hours. Anemia Panel: No results for input(s): VITAMINB12, FOLATE, FERRITIN, TIBC, IRON, RETICCTPCT in the last 72 hours. Urine analysis:    Component Value Date/Time   COLORURINE YELLOW 09/28/2018 [place] 09/28/2018 1136   LABSPEC 1.016 09/28/2018 1136   PHURINE 5.0 09/28/2018 1136   GLUCOSEU NEGATIVE 09/28/2018 1136   HGBUR NEGATIVE 09/28/2018 1136  BILIRUBINUR NEGATIVE 09/28/2018 1136   [place] 09/28/2018 1136   PROTEINUR NEGATIVE 09/28/2018 1136   NITRITE NEGATIVE 09/28/2018 1136   LEUKOCYTESUR TRACE (A) 09/28/2018 1136   Sepsis Labs: @LABRCNTIP (procalcitonin:4,lacticidven:4) )No results found for this or any previous visit (from the past 240 hour(s)).   Radiological Exams on Admission: Dg Chest 2 View  Result Date: 01/07/2019 CLINICAL DATA:  Tachycardia. EXAM: CHEST - 2 VIEW COMPARISON:  CT scan and radiographs of January 05, 2019. FINDINGS: The heart size and mediastinal contours are within normal limits. Both lungs are clear. No pneumothorax or pleural effusion is noted. The visualized skeletal structures are unremarkable. IMPRESSION: No active cardiopulmonary disease. Electronically Signed   By: Marijo Conception, M.D.   On: 01/07/2019 13:43    EKG: Independently reviewed.  Sinus tachycardia with a rate of 120, flattened T waves in the lateral leads.  Assessment/Plan Principal Problem:   Rapid palpitations Active Problems:   Chronic pelvic pain in female   Absolute anemia   Benign essential HTN     #1 palpitations: This is new onset.  Very rapid.  Mainly with activities.  Reflex sympathetic response suspected at this point.  Could be also intrinsic heart disease.   Patient will be admitted to cardiac telemetry.  Repeat thyroid studies.  Get echocardiogram.  Cardiology consultation may be required.  Switch from amlodipine to metoprolol.  #2 anemia: Hemoglobin of 10.  Appears to be her baseline.  Continue monitoring.  She has had outpatient work-up.  #3 hypertension: We will continue with switch to metoprolol.  Monitor closely.  #4 obesity: Continue dietary counseling.   DVT prophylaxis: SCD Code Status: Full code Family Communication: No family at bedside Disposition Plan: Home Consults called: Cardiology may be consulted in the morning Admission status: Inpatient  Severity of Illness: The appropriate patient status for this patient is INPATIENT. Inpatient status is judged to be reasonable and necessary in order to provide the required intensity of service to ensure the patient's safety. The patient's presenting symptoms, physical exam findings, and initial radiographic and laboratory data in the context of their chronic comorbidities is felt to place them at high risk for further clinical deterioration. Furthermore, it is not anticipated that the patient will be medically stable for discharge from the hospital within 2 midnights of admission. The following factors support the patient status of inpatient.   " The patient's presenting symptoms include palpitations. " The worrisome physical exam findings include sinus tachycardia. " The initial radiographic and laboratory data are worrisome because of no significant findings except EKG showing his sinus tachycardia. " The chronic co-morbidities include hypertension.   * I certify that at the point of admission it is my clinical judgment that the patient will require inpatient hospital care spanning beyond 2 midnights from the point of admission due to high intensity of service, high risk for further deterioration and high frequency of surveillance required.Barbette Merino MD [facility]  Pager [phone]  If 7PM-7AM, please contact night-coverage [email or web address] Password [facility]  01/07/2019, 10:36 PM

## 2019-01-07 NOTE — Progress Notes (Signed)
Patient ID: Peggy Andersen, female   DOB: April 05, 1991, 28 y.o.   MRN: [id number]       Peggy Andersen, is a 28 y.o. female  RKY:[id number]  BTD:[id number]  DOB - Sep 27, 1991  Subjective:  Chief Complaint and HPI: Peggy Andersen is a 28 y.o. female here today to establish care and for a follow up visit After being hospitalized 3/12-3/14/2020 for tachycardia and R/O PE.  Negative for PE.  Amlodipine stopped.  Metoprolol started.  IV ferraheme given. She is taking iron tid.  Calf swelling has resolved.  No SOB.  No CP/palpitations.  She has been anemic since at least age 33 and denies heavy periods. She has a gyn f/up appt at the end of April.  She will need repeat CBC and iron studies in a few weeks.  Tolerating metoprolol w/o SE.     Brief/Interim Summary: Peggy Muhammad-Balkumis a 28 y.o.femalewith medical history significant ofhypertension,chronic iron deficiency anemia,PCOS, asthma,headache and GERD who came in to the ER about 2 days ago after she went to her doctor and was told she had palpitations. She was having heart rate in the 130s to 140s. Suspected PE at the time with CT angiogram negative. TSH was checked and 0.7 within normal range. She did have swelling of her lower extremity at the time which is new. Patient has had no prior cardiac history and no significant family history except for valvular disease in her mother. Patient was treated and sent home. Shewas startedon amlodipineon 3/6, started having palpitations on 3/10.Her heart rate goes as high as 150s to 160s with activity. Herresting heart rate is around 100.She wasadmitted for further work-up of her sinus tachycardia.  Studies revealed iron deficiency anemia, patient was given IV Feraheme.  Echocardiogram revealed EF 55 to 10%, normal diastolic parameter.  Patient was also given IV fluids.  Amlodipine was held.  Orthostatic blood pressure was negative, her heart rate did  increase >30 points with activity.  Question diagnosis of postural orthostatic tachycardia syndrome (POTS).   Discharge Diagnoses:  Principal Problem:   Sinus tachycardia Active Problems:   Chronic pelvic pain in female   Chronic iron deficiency anemia   Benign essential HTN   Possible POTS (Postural Orthostatic Tachycardia Syndrome)  Patient was started new medication amlodipine on 3/6.  Her symptoms started 3/10.  Amlodipine has been held since 3/10, metoprolol was started.  She continued to have tachycardia with activity, not at rest.  She also has chronic anemia, baseline hemoglobin of around 9.  Iron levels were checked, she was given IV Feraheme. Orthostatic BP was negative. Heart rate did increase >30 points with activity Possibility of POTS.  Could consider initiation of fludrocortisone 0.05 to 0.2 mg/day (per Up to Date) Ambulated patient prior to discharge home. HR remained 110s, complained of mild headache without dizziness, lightheadedness, chest pain, shortness of breath   Chronic iron deficiency anemia Denies any GI blood loss S/p IV Feraheme Patient has follow-up with pelvic ultrasound as an outpatient  Essential hypertension Stop amlodipine Metoprolol started BP stable  ED/Hospital notes reviewed.    Family history:  Mom -stroke, htn, DM  ROS:   Constitutional:  No f/c, No night sweats, No unexplained weight loss. EENT:  No vision changes, No blurry vision, No hearing changes. No mouth, throat, or ear problems.  Respiratory: No cough, No SOB Cardiac: No CP, no palpitations GI:  No abd pain, No N/V/D. GU: No Urinary s/sx Musculoskeletal: No joint pain Neuro: No headache, no dizziness, no motor weakness.  Skin: No rash Endocrine:  No polydipsia. No polyuria.  Psych: Denies SI/HI  No problems updated.  ALLERGIES: Allergies  Allergen Reactions   Nsaids Other (See Comments)    Pt. Stated she get severe stomach cramps .   Percocet  [Oxycodone-Acetaminophen] Other (See Comments)    Severe stomach cramps    PAST MEDICAL HISTORY: Past Medical History:  Diagnosis Date   Anemia    Asthma    Hypertension    PCOS (polycystic ovarian syndrome)     MEDICATIONS AT HOME: Prior to Admission medications   Medication Sig Start Date End Date Taking? Authorizing Provider  albuterol (VENTOLIN HFA) 108 (90 Base) MCG/ACT inhaler Inhale 2 puffs into the lungs every 6 (six) hours as needed for wheezing or shortness of breath.    [provider]  ferrous sulfate 325 (65 FE) MG tablet Take 325 mg by mouth 3 (three) times daily with meals.    [provider]  metoprolol tartrate (LOPRESSOR) 50 MG tablet Take 1 tablet (50 mg total) by mouth 2 (two) times daily. 01/09/19   Dessa Phi, DO  omeprazole (PRILOSEC) 20 MG capsule Take 1 capsule (20 mg total) by mouth daily. 09/28/18   Recardo Evangelist, PA-C  vitamin B-12 (CYANOCOBALAMIN) 1000 MCG tablet Take 1,000 mcg by mouth 2 (two) times daily.     [provider]     Objective:  EXAM:   Vitals:   01/13/19 0946  BP: 127/89  Pulse: 99  Resp: 16  Temp: 98.6 F (37 C)  TempSrc: Oral  SpO2: 100%  Weight: 271 lb 12.8 oz (123.3 kg)  Height: 5\' 6"  (1.676 m)    General appearance : A&OX3. NAD. Non-toxic-appearing HEENT: Atraumatic and Normocephalic.  PERRLA. EOM intact.   Neck: supple, no JVD. No cervical lymphadenopathy. No thyromegaly Chest/Lungs:  Breathing-non-labored, Good air entry bilaterally, breath sounds normal without rales, rhonchi, or wheezing  CVS: S1 S2 regular, no murmurs, gallops, rubs  Extremities: Bilateral Lower Ext shows no edema, both legs are warm to touch with = pulse throughout Neurology:  CN II-XII grossly intact, Non focal.   Psych:  TP linear. J/I WNL. Normal speech. Appropriate eye contact and affect.  Skin:  No Rash  Data Review No results found for: HGBA1C   Assessment & Plan   1. Other iron deficiency  anemia Anemic at least since first diagnosed at age 11. Continue Iron Tid.  Recheck CBC and iron studies at f/up.    2. Benign essential HTN Controlled-continue metoprolol  3. Sinus tachycardia Improved on metoprolol.  Hydration imperative  4. Hospital discharge follow-up Much improved.     Patient have been counseled extensively about nutrition and exercise  Return for 4/3 establish care appt with Peggy Andersen.  .  The patient was given clear instructions to go to ER or return to medical center if symptoms don't improve, worsen or new problems develop. The patient verbalized understanding. The patient was told to call to get lab results if they haven't heard anything in the next week.     Freeman Caldron, PA-C [facility] and [facility] [place], [place]   01/13/2019, 9:56 AM

## 2019-01-07 NOTE — Discharge Instructions (Addendum)
Postural Orthostatic Tachycardia Syndrome  Postural orthostatic tachycardia syndrome (POTS) is a group of symptoms that occur when a person stands up after lying down. POTS occurs when less blood than normal flows to the body when you stand up. The reduced blood flow to the body makes the heart beat rapidly.  POTS may be associated with another medical condition, or it may occur on its own.  What are the causes?  The cause of this condition is not known, but many conditions and diseases are associated with it.  What increases the risk?  This condition is more likely to develop in:  · Women 15-50 years old.  · Women who are pregnant.  · Women who are in their period (menstruating).  · People who have certain conditions, such as:  ? Infection from a virus.  ? Attacks of healthy organs by the body's immunity (autoimmune disease).  ? Losing a lot of red blood cells (anemia).  ? Losing too much water in the body (dehydration).  ? An overactive thyroid (hyperthyroidism).  · People who take certain medicines.  · People who have had a major injury.  · People who have had surgery.  What are the signs or symptoms?  The most common symptom of this condition is light-headedness when one stands from a lying or sitting position. Other symptoms may include:  · Feeling a rapid increase in the heartbeat (tachycardia) within 10 minutes of standing up.  · Fainting.  · Weakness.  · Confusion.  · Trembling.  · Shortness of breath.  · Sweating or flushing.  · Headache.  · Chest pain.  · Breathing that is deeper and faster than normal (hyperventilation).  · Nausea.  · Anxiety.  Symptoms may be worse in the morning, and they may be relieved by lying down.  How is this diagnosed?  This condition is diagnosed based on:  · Your symptoms.  · Your medical history.  · A physical exam.  · Checking your heart rate when you are lying down and after you stand up.  · Checking your blood pressure when you go from lying down to standing up.  · Blood  tests to measure hormones that change with blood pressure. The blood tests will be done when you are lying down and when you are standing up.  You may have other tests to check for conditions or diseases that are associated with POTS.  How is this treated?  Treatment for this condition depends on how severe your symptoms are and whether you have any conditions or diseases that are associated with POTS. Treatment may involve:  · Treating any conditions or diseases that are associated with POTS.  · Drinking two glasses of water before getting up from a lying position.  · Eating more salt (sodium).  · Taking medicine to control blood pressure and heart rate (beta-blocker).  · Avoiding certain medicines.  · Starting an exercise program under the supervision of a health care provider.  Follow these instructions at home:  Medicines  · Take over-the-counter and prescription medicines only as told by your health care provider.  · Let your health care provider know about all prescription or over-the-counter medicines. These include herbs, vitamins, and supplements. You may need to stop or adjust some medicines if they cause this condition.  · Talk with your health care provider before starting any new medicines.  Eating and drinking    · Drink enough fluid to keep your urine pale yellow.  · If   told by your health care provider, drink two glasses of water before getting up from a lying position.  · Follow instructions from your health care provider about how much sodium you should eat.  · Avoid heavy meals. Eat several small meals a day instead of a few large meals.  General instructions  · Do an aerobic exercise for 20 minutes a day, at least 3 days a week.  · Ask your health care provider what kinds of exercise are safe for you.  · Do not use any products that contain nicotine or tobacco, such as cigarettes and e-cigarettes. These can interfere with blood flow. If you need help quitting, ask your health care  provider.  · Keep all follow-up visits as told by your health care provider. This is important.  Contact a health care provider if:  · Your symptoms do not improve after treatment.  · Your symptoms get worse.  · You develop new symptoms.  Get help right away if:  · You have chest pain.  · You have difficulty breathing.  · You have fainting episodes.  These symptoms may represent a serious problem that is an emergency. Do not wait to see if the symptoms will go away. Get medical help right away. Call your local emergency services (911 in the [place] Do not drive yourself to the hospital.  Summary  · POTS is a condition that can cause light-headedness, fainting, and palpitations when you go from a sitting or lying position to a standing position. It occurs when less blood than normal flows to the body when you stand up.  · Treatment for this condition includes treating any underlying conditions, drinking plenty of water, stopping or changing some medicines, or starting an exercise program.  · Get help right away if you have chest pain, difficulty breathing, or fainting episodes. These may represent a serious problem that is an emergency.  This information is not intended to replace advice given to you by your health care provider. Make sure you discuss any questions you have with your health care provider.  Document Released: 10/04/2002 Document Revised: 11/25/2017 Document Reviewed: 11/25/2017  Elsevier Interactive Patient Education © 2019 [employer]

## 2019-01-07 NOTE — ED Triage Notes (Signed)
Pt reports she was here 2 days ago for tachycardia, a headache, and heartburn. Pt reports she still is not getting any better.

## 2019-01-07 NOTE — ED Provider Notes (Signed)
[facility] EMERGENCY DEPARTMENT Provider Note   CSN: [id number] Arrival date & time: 01/07/19  1255    History   Chief Complaint Chief Complaint  Patient presents with  . Tachycardia  . Headache  . Heartburn    HPI Peggy Andersen is a 28 y.o. female.     HPI  Patient is a 28 year old female with a history of anemia, asthma, hypertension, PCOS, who presents the emergency department today for evaluation of palpitations, elevated heart rate, headache and heartburn.  Patient states she began having palpitations and elevated heart rate 2 days ago immediately after undergoing a pelvic ultrasound.  At that time she also noticed some swelling in her lower extremities.  Since then she has had some burning in her chest.  States that "I would not describe it as pain ".  States it feels like when she has had acid reflux in the past.  She takes omeprazole and it has not improved her symptoms.  Pain does not radiate.  She has no associated shortness of breath.  No diaphoresis.  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  She had one episode of vomiting yesterday and one episode of vomiting today.  She also describes bilateral temporal headache that is worse with standing.  No vision changes, dizziness or lightheadedness at rest.  Does have some lightheadedness when standing up.  No unilateral numbness or weakness.  States she was seen by her PCP on 01/01/2019.  She had labs drawn including TSH which were all normal.  She did have elevated blood pressure at that time and was started on amlodipine.  She was also seen in the ED 2 days ago for similar symptoms.  She had a very thorough work-up including a CT angiogram which was negative for PE.  She was given IV fluids and her heart rate improved.  Orthostatics were positive at that time prior  Past Medical History:  Diagnosis Date  . Anemia   . Asthma   . Hypertension    . PCOS (polycystic ovarian syndrome)     Patient Active Problem List   Diagnosis Date Noted  . Benign essential HTN 01/07/2019  . Sinus tachycardia 01/07/2019  . Chronic pelvic pain in female 01/01/2019  . Chronic iron deficiency anemia 01/01/2019    Past Surgical History:  Procedure Laterality Date  . LAPAROSCOPIC GASTRIC SLEEVE RESECTION    . TUBAL LIGATION       OB History   No obstetric history on file.      Home Medications    Prior to Admission medications   Medication Sig Start Date End Date Taking? Authorizing Provider  albuterol (VENTOLIN HFA) 108 (90 Base) MCG/ACT inhaler Inhale 2 puffs into the lungs every 6 (six) hours as needed for wheezing or shortness of breath.   Yes [provider]  amLODipine (NORVASC) 5 MG tablet Take 1 tablet (5 mg total) by mouth daily. 01/01/19  Yes Gildardo Pounds, NP  ferrous sulfate 325 (65 FE) MG tablet Take 325 mg by mouth 3 (three) times daily with meals.   Yes [provider]  omeprazole (PRILOSEC) 20 MG capsule Take 1 capsule (20 mg total) by mouth daily. 09/28/18  Yes Recardo Evangelist, PA-C  vitamin B-12 (CYANOCOBALAMIN) 1000 MCG tablet Take 1,000 mcg by mouth 2 (two) times daily.    Yes [provider]    Family History Family History  Problem Relation Age  of Onset  . Stroke Mother   . Diabetes Mother   . Hypertension Mother     Social History Social History   Tobacco Use  . Smoking status: Passive Smoke Exposure - Never Smoker  . Smokeless tobacco: Never Used  Substance Use Topics  . Alcohol use: Never    Frequency: Never  . Drug use: Never     Allergies   Nsaids and Percocet [oxycodone-acetaminophen]   Review of Systems Review of Systems  Constitutional: Negative for fever.  HENT: Negative for ear pain and sore throat.   Eyes: Negative for visual disturbance.  Respiratory: Positive for shortness of breath (with exertion). Negative for cough.   Cardiovascular: Positive for  palpitations and leg swelling. Negative for chest pain.       Burning in chest  Gastrointestinal: Positive for nausea. Negative for abdominal pain, blood in stool, constipation, diarrhea and vomiting.  Genitourinary: Negative for dysuria and hematuria.  Musculoskeletal: Negative for back pain.  Skin: Negative for color change and rash.  Neurological: Negative for headaches.  All other systems reviewed and are negative.   Physical Exam Updated Vital Signs BP 129/90 (BP Location: Right Arm)   Pulse 94   Temp 98.2 F (36.8 C) (Oral)   Resp 15   Ht 5\' 6"  (1.676 m)   Wt 123.9 kg   LMP 12/14/2018 (Exact Date)   SpO2 99%   BMI 44.10 kg/m   Physical Exam Vitals signs and nursing note reviewed.  Constitutional:      General: She is not in acute distress.    Appearance: She is well-developed. She is not ill-appearing, toxic-appearing or diaphoretic.  HENT:     Head: Normocephalic and atraumatic.  Eyes:     Conjunctiva/sclera: Conjunctivae normal.  Neck:     Musculoskeletal: Neck supple.  Cardiovascular:     Rate and Rhythm: Regular rhythm. Tachycardia present.     Heart sounds: Normal heart sounds. No murmur.  Pulmonary:     Effort: Pulmonary effort is normal. No respiratory distress.     Breath sounds: Normal breath sounds. No stridor. No rhonchi.  Abdominal:     General: Bowel sounds are normal. There is no distension.     Palpations: Abdomen is soft.     Tenderness: There is no abdominal tenderness.  Musculoskeletal:     Comments: Swelling to bilateral lower extremities without pitting edema.  Skin:    General: Skin is warm and dry.  Neurological:     Mental Status: She is alert.     Comments: Mental Status:  Alert, thought content appropriate, able to give a coherent history. Speech fluent without evidence of aphasia. Able to follow 2 step commands without difficulty.  Cranial Nerves:  II:   pupils equal, round, reactive to light III,IV, VI: ptosis not present,  extra-ocular motions intact bilaterally  V,VII: smile symmetric, facial light touch sensation equal VIII: hearing grossly normal to voice  X: uvula elevates symmetrically  XI: bilateral shoulder shrug symmetric and strong XII: midline tongue extension without fassiculations Motor:  Normal tone. 5/5 strength of BUE and BLE major muscle groups including strong and equal grip strength and dorsiflexion/plantar flexion Sensory: light touch normal in all extremities. Cerebellar: normal finger-to-nose with bilateral upper extremities Gait: normal gait and balance.  CV: 2+ radial and DP/PT pulses  Psychiatric:     Comments: Appears somewhat anxious about symptoms.    ED Treatments / Results  Labs (all labs ordered are listed, but only abnormal results are displayed)  Labs Reviewed  BASIC METABOLIC PANEL - Abnormal; Notable for the following components:      Result Value   CO2 19 (*)    All other components within normal limits  CBC - Abnormal; Notable for the following components:   Hemoglobin 10.2 (*)    HCT 35.5 (*)    MCV 70.0 (*)    MCH 20.1 (*)    MCHC 28.7 (*)    RDW 19.0 (*)    All other components within normal limits  COMPREHENSIVE METABOLIC PANEL - Abnormal; Notable for the following components:   BUN 5 (*)    Calcium 8.7 (*)    Albumin 3.3 (*)    All other components within normal limits  CBC - Abnormal; Notable for the following components:   Hemoglobin 9.4 (*)    HCT 31.1 (*)    MCV 69.1 (*)    MCH 20.9 (*)    RDW 18.7 (*)    All other components within normal limits  IRON AND TIBC - Abnormal; Notable for the following components:   Iron 24 (*)    Saturation Ratios 6 (*)    All other components within normal limits  FERRITIN - Abnormal; Notable for the following components:   Ferritin 6 (*)    All other components within normal limits  T4, FREE  CBC  I-STAT TROPONIN, ED  I-STAT BETA HCG BLOOD, ED ([facility], [facility], [facility] ONLY)    EKG None  Radiology Dg Chest 2 View   Result Date: 01/07/2019 CLINICAL DATA:  Tachycardia. EXAM: CHEST - 2 VIEW COMPARISON:  CT scan and radiographs of January 05, 2019. FINDINGS: The heart size and mediastinal contours are within normal limits. Both lungs are clear. No pneumothorax or pleural effusion is noted. The visualized skeletal structures are unremarkable. IMPRESSION: No active cardiopulmonary disease. Electronically Signed   By: Marijo Conception, M.D.   On: 01/07/2019 13:43    Procedures Procedures (including critical care time)  Medications Ordered in ED Medications  sodium chloride flush (NS) 0.9 % injection 3 mL (has no administration in time range)  ferrous sulfate tablet 325 mg (325 mg Oral Given 01/08/19 1613)  pantoprazole (PROTONIX) EC tablet 40 mg (40 mg Oral Given 01/08/19 0921)  albuterol (PROVENTIL) (2.5 MG/3ML) 0.083% nebulizer solution 2.5 mg (has no administration in time range)  vitamin B-12 (CYANOCOBALAMIN) tablet 1,000 mcg (1,000 mcg Oral Given 01/08/19 0921)  metoprolol tartrate (LOPRESSOR) tablet 50 mg (50 mg Oral Given 01/08/19 0921)  0.9 %  sodium chloride infusion ( Intravenous New Bag/Given 01/08/19 1614)  ondansetron (ZOFRAN) tablet 4 mg (has no administration in time range)    Or  ondansetron (ZOFRAN) injection 4 mg (has no administration in time range)  acetaminophen (TYLENOL) tablet 650 mg (650 mg Oral Given 01/08/19 0414)  sodium chloride 0.9 % bolus 1,000 mL (0 mLs Intravenous Stopped 01/07/19 2144)  acetaminophen (TYLENOL) tablet 650 mg (650 mg Oral Given 01/07/19 1923)  metoCLOPramide (REGLAN) injection 5 mg (5 mg Intravenous Given 01/07/19 1935)  diphenhydrAMINE (BENADRYL) injection 12.5 mg (12.5 mg Intravenous Given 01/07/19 1936)  alum & mag hydroxide-simeth (MAALOX/MYLANTA) 200-200-20 MG/5ML suspension 30 mL (30 mLs Oral Given 01/07/19 2049)  famotidine (PEPCID) IVPB 20 mg premix (0 mg Intravenous Stopped 01/07/19 2121)  ferumoxytol (FERAHEME) 510 mg in sodium chloride 0.9 % 100 mL IVPB (  Intravenous Stopped 01/08/19 1205)     Initial Impression / Assessment and Plan / ED Course  I have reviewed the triage vital signs and  the nursing notes.  Pertinent labs & imaging results that were available during my care of the patient were reviewed by me and considered in my medical decision making (see chart for details).      Final Clinical Impressions(s) / ED Diagnoses   Final diagnoses:  Palpitations   Patient presenting with continued palpitations, heartburn, and now headache.  She is tachycardic on arrival but otherwise has normal vital signs.    No murmurs heard on exam.  Elevated rate but regular rhythm.  Lungs are clear to auscultation bilaterally.  Does have some lower extremity swelling without pitting edema.  Neurologic exam is normal.  Reviewed work-up from prior visit 2 days ago.  Labs are reassuring at that time.  Imaging of the chest was negative for PE or other acute abnormality.  Reviewed labs from her work-up with PCP.  Had negative TSH and otherwise normal labs.  Today CBC shows anemia that is stable.  No leukocytosis.  Her electrolytes are within normal limits.  Her bicarb is slightly low.  Her troponin is negative.  Pregnancy test is negative.  X-ray does not show any acute cardiac or pulmonary etiology.  EKG shows sinus tachycardia with a heart rate of 120.  Nonspecific T wave abnormality.  No S1Q3T3. No delta wave.   Pt given IVF and medications to tx her HA and acid reflux. On re-eval pt is still receiving IVF. Heart rate has improved and is 90 on monitor. Her HA is resolved. Her chest burning and palpitations have resolved.  Ever, I ambulated the patient about the department and as soon as she stood up all of her symptoms return.  She stated that her headache returned as well as the palpitations.  She ambulated up and down the hallway and heart rate became elevated to 132.  Her O2 sats remained within normal limits.  She did become somewhat tachypneic but she  did not complain of any dyspnea.  Case discussed with Dr. Eulis Foster who recommended admission given pts persistent sxs following tx.   Given the persistence of her symptoms will plan for admission for further w/u and treatment.   Case discussed with Dr. Jonelle Sidle who accepts pt for admission.   ED Discharge Orders    None       Bishop Dublin 01/08/19 2145    Daleen Bo, MD 01/10/19 1124

## 2019-01-08 ENCOUNTER — Other Ambulatory Visit: Payer: Self-pay | Admitting: Nurse Practitioner

## 2019-01-08 ENCOUNTER — Inpatient Hospital Stay (HOSPITAL_COMMUNITY): Payer: Medicaid Other

## 2019-01-08 DIAGNOSIS — R000 Tachycardia, unspecified: Secondary | ICD-10-CM

## 2019-01-08 DIAGNOSIS — D259 Leiomyoma of uterus, unspecified: Secondary | ICD-10-CM

## 2019-01-08 DIAGNOSIS — R0602 Shortness of breath: Secondary | ICD-10-CM

## 2019-01-08 LAB — IRON AND TIBC
Iron: 24 ug/dL — ABNORMAL LOW (ref 28–170)
Saturation Ratios: 6 % — ABNORMAL LOW (ref 10.4–31.8)
TIBC: 433 ug/dL (ref 250–450)
UIBC: 409 ug/dL

## 2019-01-08 LAB — COMPREHENSIVE METABOLIC PANEL
ALBUMIN: 3.3 g/dL — AB (ref 3.5–5.0)
ALT: 11 U/L (ref 0–44)
AST: 17 U/L (ref 15–41)
Alkaline Phosphatase: 70 U/L (ref 38–126)
Anion gap: 5 (ref 5–15)
BUN: 5 mg/dL — ABNORMAL LOW (ref 6–20)
CHLORIDE: 111 mmol/L (ref 98–111)
CO2: 22 mmol/L (ref 22–32)
Calcium: 8.7 mg/dL — ABNORMAL LOW (ref 8.9–10.3)
Creatinine, Ser: 0.67 mg/dL (ref 0.44–1.00)
GFR calc Af Amer: >60 mL/min (ref >60)
GFR calc non Af Amer: >60 mL/min (ref >60)
Glucose, Bld: 79 mg/dL (ref 70–99)
Potassium: 3.6 mmol/L (ref 3.5–5.1)
Sodium: 138 mmol/L (ref 135–145)
Total Bilirubin: 0.6 mg/dL (ref 0.3–1.2)
Total Protein: 6.7 g/dL (ref 6.5–8.1)

## 2019-01-08 LAB — CBC
HCT: 31.1 % — ABNORMAL LOW (ref 36.0–46.0)
Hemoglobin: 9.4 g/dL — ABNORMAL LOW (ref 12.0–15.0)
MCH: 20.9 pg — ABNORMAL LOW (ref 26.0–34.0)
MCHC: 30.2 g/dL (ref 30.0–36.0)
MCV: 69.1 fL — ABNORMAL LOW (ref 80.0–100.0)
Platelets: 295 10*3/uL (ref 150–400)
RBC: 4.5 MIL/uL (ref 3.87–5.11)
RDW: 18.7 % — ABNORMAL HIGH (ref 11.5–15.5)
WBC: 5.4 10*3/uL (ref 4.0–10.5)
nRBC: 0 % (ref 0.0–0.2)

## 2019-01-08 LAB — T4, FREE: FREE T4: 0.94 ng/dL (ref 0.82–1.77)

## 2019-01-08 LAB — FERRITIN: FERRITIN: 6 ng/mL — AB (ref 11–307)

## 2019-01-08 LAB — ECHOCARDIOGRAM COMPLETE
Height: 66 in
Weight: 4371.2 oz

## 2019-01-08 MED ORDER — ACETAMINOPHEN 325 MG PO TABS
650.0000 mg | ORAL_TABLET | Freq: Four times a day (QID) | ORAL | Status: DC | PRN
  Administered 2019-01-08 (×2): 650 mg via ORAL
  Filled 2019-01-08 (×2): qty 2

## 2019-01-08 MED ORDER — SODIUM CHLORIDE 0.9 % IV SOLN
510.0000 mg | Freq: Once | INTRAVENOUS | Status: AC
  Administered 2019-01-08: 510 mg via INTRAVENOUS
  Filled 2019-01-08: qty 17

## 2019-01-08 NOTE — Progress Notes (Signed)
  Echocardiogram 2D Echocardiogram has been performed.  Peggy Andersen 01/08/2019, 3:28 PM

## 2019-01-08 NOTE — Progress Notes (Signed)
PROGRESS NOTE    Peggy Andersen  VZC:[id number] DOB: Nov 28, 1990 DOA: 01/07/2019 PCP: Patient, No Pcp Per     Brief Narrative:  Peggy Andersen is a 28 y.o. female with medical history significant of hypertension, chronic iron deficiency anemia, PCOS, asthma, headache and GERD who came in to the ER about 2 days ago after she went to her doctor and was told she had palpitations.  She was having heart rate in the 130s to 140s.  Suspected PE at the time with CT angiogram negative.  TSH was checked and 0.7 within normal range.  She did have swelling of her lower extremity at the time which is new.  Patient has had no prior cardiac history and no significant family history except for valvular disease in her mother.  Patient was treated and sent home.  She was started on amlodipine on 3/6, started having palpitations on 3/10. Her heart rate goes as high as 150s to 160s with activity.  Her resting heart rate is around 100. She was admitted for further work-up of her sinus tachycardia.  New events last 24 hours / Subjective: At rest, she is asymptomatic.  With exertion to the bathroom, she becomes tachycardic with heart rate in the 120s.  She also admits to some exertional shortness of breath.  Assessment & Plan:   Principal Problem:   Sinus tachycardia Active Problems:   Chronic pelvic pain in female   Chronic iron deficiency anemia   Benign essential HTN   Sinus tachycardia Could be secondary to recent amlodipine initiation vs chronic anemia Stop amlodipine, metoprolol was started Echocardiogram pending Due to her chronic iron deficiency anemia, iron level was repeated Check orthostatic vital sign  Chronic iron deficiency anemia Denies any GI blood loss Give IV Feraheme Patient has follow-up with pelvic ultrasound as an outpatient  Essential hypertension Stop amlodipine Metoprolol started BP stable   DVT prophylaxis: SCD Code Status: Full code Family  Communication: No family at bedside Disposition Plan: Pending further work-up, improvement in her tachycardia   Consultants:   None  Procedures:   None  Antimicrobials:  Anti-infectives (From admission, onward)   None        Objective: Vitals:   01/07/19 2250 01/08/19 0059 01/08/19 0652 01/08/19 0921  BP: (!) 130/91 128/86 130/89 124/83  Pulse: (!) 112 90 95 93  Resp: 20     Temp: 98 F (36.7 C)  (!) 97.5 F (36.4 C)   TempSrc: Oral  Oral   SpO2: 100%  100%   Weight: 125.2 kg  123.9 kg   Height: 5\' 6"  (1.676 m)       Intake/Output Summary (Last 24 hours) at 01/08/2019 1147 Last data filed at 01/08/2019 1100 Gross per 24 hour  Intake 2521.39 ml  Output -  Net 2521.39 ml   Filed Weights   01/07/19 1310 01/07/19 2250 01/08/19 0652  Weight: 124.3 kg 125.2 kg 123.9 kg    Examination:  General exam: Appears calm and comfortable  Respiratory system: Clear to auscultation. Respiratory effort normal. Cardiovascular system: S1 & S2 heard, RRR. No JVD, murmurs, rubs, gallops or clicks. No pedal edema. Gastrointestinal system: Abdomen is nondistended, soft and nontender. No organomegaly or masses felt. Normal bowel sounds heard. Central nervous system: Alert and oriented. No focal neurological deficits. Extremities: Symmetric 5 x 5 power. Skin: No rashes, lesions or ulcers Psychiatry: Judgement and insight appear normal. Mood & affect appropriate.   Data Reviewed: I have personally reviewed following labs and imaging studies  CBC: Recent Labs  Lab 01/05/19 1721 01/07/19 1328 01/08/19 0348  WBC 7.1 5.9 5.4  HGB 10.1* 10.2* 9.4*  HCT 34.8* 35.5* 31.1*  MCV 70.0* 70.0* 69.1*  PLT 283 322 037   Basic Metabolic Panel: Recent Labs  Lab 01/01/19 1734 01/05/19 1721 01/07/19 1328 01/08/19 0348  NA 141 136 136 138  K 3.8 3.3* 3.7 3.6  CL 107* 111 111 111  CO2 21 17* 19* 22  GLUCOSE 82 94 98 79  BUN 9 9 7  5*  CREATININE 0.76 0.78 0.73 0.67  CALCIUM 8.7 9.1  8.9 8.7*   GFR: Estimated Creatinine Clearance: 140.7 mL/min (by C-G formula based on SCr of 0.67 mg/dL). Liver Function Tests: Recent Labs  Lab 01/01/19 1734 01/08/19 0348  AST 11 17  ALT 9 11  ALKPHOS 77 70  BILITOT <0.2 0.6  PROT 6.6 6.7  ALBUMIN 4.1 3.3*   No results for input(s): LIPASE, AMYLASE in the last 168 hours. No results for input(s): AMMONIA in the last 168 hours. Coagulation Profile: No results for input(s): INR, PROTIME in the last 168 hours. Cardiac Enzymes: No results for input(s): CKTOTAL, CKMB, CKMBINDEX, TROPONINI in the last 168 hours. BNP (last 3 results) No results for input(s): PROBNP in the last 8760 hours. HbA1C: No results for input(s): HGBA1C in the last 72 hours. CBG: No results for input(s): GLUCAP in the last 168 hours. Lipid Profile: No results for input(s): CHOL, HDL, LDLCALC, TRIG, CHOLHDL, LDLDIRECT in the last 72 hours. Thyroid Function Tests: Recent Labs    01/07/19 2307  FREET4 0.94   Anemia Panel: Recent Labs    01/08/19 0800  FERRITIN 6*  TIBC 433  IRON 24*   Sepsis Labs: No results for input(s): PROCALCITON, LATICACIDVEN in the last 168 hours.  No results found for this or any previous visit (from the past 240 hour(s)).     Radiology Studies: Dg Chest 2 View  Result Date: 01/07/2019 CLINICAL DATA:  Tachycardia. EXAM: CHEST - 2 VIEW COMPARISON:  CT scan and radiographs of January 05, 2019. FINDINGS: The heart size and mediastinal contours are within normal limits. Both lungs are clear. No pneumothorax or pleural effusion is noted. The visualized skeletal structures are unremarkable. IMPRESSION: No active cardiopulmonary disease. Electronically Signed   By: Marijo Conception, M.D.   On: 01/07/2019 13:43      Scheduled Meds: . ferrous sulfate  325 mg Oral TID WC  . metoprolol tartrate  50 mg Oral BID  . pantoprazole  40 mg Oral Daily  . sodium chloride flush  3 mL Intravenous Once  . vitamin B-12  1,000 mcg Oral BID    Continuous Infusions: . sodium chloride 125 mL/hr at 01/08/19 1100  . ferumoxytol 510 mg (01/08/19 1147)     LOS: 1 day    Time spent: 35 minutes   Dessa Phi, DO [facility] [email or web address] 01/08/2019, 11:47 AM

## 2019-01-08 NOTE — Progress Notes (Signed)
HR increased to 150 bpm ambulating to BR.  BP 136/85. Denies dizziness, c/o headache.  Dr. Maylene Roes notified.

## 2019-01-08 NOTE — Progress Notes (Signed)
Patient HR increase to 130s with ambulation to BR.  States she gets headache but denies dizziness at this time.  HR returns to 90-100 once patient back in bed.

## 2019-01-09 LAB — CBC
HCT: 32 % — ABNORMAL LOW (ref 36.0–46.0)
Hemoglobin: 9.7 g/dL — ABNORMAL LOW (ref 12.0–15.0)
MCH: 20.9 pg — AB (ref 26.0–34.0)
MCHC: 30.3 g/dL (ref 30.0–36.0)
MCV: 69 fL — ABNORMAL LOW (ref 80.0–100.0)
Platelets: 370 10*3/uL (ref 150–400)
RBC: 4.64 MIL/uL (ref 3.87–5.11)
RDW: 19.2 % — AB (ref 11.5–15.5)
WBC: 4.8 10*3/uL (ref 4.0–10.5)
nRBC: 0 % (ref 0.0–0.2)

## 2019-01-09 MED ORDER — METOPROLOL TARTRATE 50 MG PO TABS: 50.0000 mg | ORAL_TABLET | Freq: Two times a day (BID) | ORAL | 0 refills | Status: DC

## 2019-01-09 NOTE — Discharge Summary (Signed)
Physician Discharge Summary  Peggy Andersen EAV:[id number] DOB: 1991/10/21 DOA: 01/07/2019  PCP: Patient, No Pcp Per  Admit date: 01/07/2019 Discharge date: 01/09/2019  Admitted From: Home Disposition:  Home  Recommendations for Outpatient Follow-up:  1. Follow up with PCP in 1 week 2. Obtain iron studies as outpatient in several weeks  Discharge Condition: Stable CODE STATUS: Full  Diet recommendation: Heart healthy   Brief/Interim Summary: Peggy Andersen a 28 y.o.femalewith medical history significant ofhypertension,chronic iron deficiency anemia,PCOS, asthma, headache and GERD who came in to the ER about 2 days ago after she went to her doctor and was told she had palpitations. She was having heart rate in the 130s to 140s. Suspected PE at the time with CT angiogram negative. TSH was checked and 0.7 within normal range. She did have swelling of her lower extremity at the time which is new. Patient has had no prior cardiac history and no significant family history except for valvular disease in her mother. Patient was treated and sent home. She was started on amlodipine on 3/6, started having palpitations on 3/10. Her heart rate goes as high as 150s to 160s with activity. Herresting heart rate is around 100. She was admitted for further work-up of her sinus tachycardia.  Studies revealed iron deficiency anemia, patient was given IV Feraheme.  Echocardiogram revealed EF 55 to 78%, normal diastolic parameter.  Patient was also given IV fluids.  Amlodipine was held.  Orthostatic blood pressure was negative, her heart rate did increase >30 points with activity.  Question diagnosis of postural orthostatic tachycardia syndrome (POTS).   Discharge Diagnoses:  Principal Problem:   Sinus tachycardia Active Problems:   Chronic pelvic pain in female   Chronic iron deficiency anemia   Benign essential HTN   Possible POTS (Postural Orthostatic Tachycardia Syndrome)   Patient was started new medication amlodipine on 3/6.  Her symptoms started 3/10.  Amlodipine has been held since 3/10, metoprolol was started.  She continued to have tachycardia with activity, not at rest.  She also has chronic anemia, baseline hemoglobin of around 9.  Iron levels were checked, she was given IV Feraheme. Orthostatic BP was negative. Heart rate did increase >30 points with activity Possibility of POTS.  Could consider initiation of fludrocortisone 0.05 to 0.2 mg/day (per Up to Date) Ambulated patient prior to discharge home. HR remained 110s, complained of mild headache without dizziness, lightheadedness, chest pain, shortness of breath   Chronic iron deficiency anemia Denies any GI blood loss S/p IV Feraheme Patient has follow-up with pelvic ultrasound as an outpatient  Essential hypertension Stop amlodipine Metoprolol started BP stable   Discharge Instructions  Discharge Instructions    Call MD for:  difficulty breathing, headache or visual disturbances   Complete by:  As directed    Call MD for:  extreme fatigue   Complete by:  As directed    Call MD for:  persistant dizziness or light-headedness   Complete by:  As directed    Call MD for:  severe uncontrolled pain   Complete by:  As directed    Diet - low sodium heart healthy   Complete by:  As directed    Discharge instructions   Complete by:  As directed    You were cared for by a hospitalist during your hospital stay. If you have any questions about your discharge medications or the care you received while you were in the hospital after you are discharged, you can call the unit and ask to  speak with the hospitalist on call if the hospitalist that took care of you is not available. Once you are discharged, your primary care physician will handle any further medical issues. Please note that NO REFILLS for any discharge medications will be authorized once you are discharged, as it is imperative that you return  to your primary care physician (or establish a relationship with a primary care physician if you do not have one) for your aftercare needs so that they can reassess your need for medications and monitor your lab values.   Increase activity slowly   Complete by:  As directed      Allergies as of 01/09/2019      Reactions   Nsaids Other (See Comments)   Pt. Stated she get severe stomach cramps .   Percocet [oxycodone-acetaminophen] Other (See Comments)   Severe stomach cramps      Medication List    STOP taking these medications   amLODipine 5 MG tablet Commonly known as:  NORVASC     TAKE these medications   ferrous sulfate 325 (65 FE) MG tablet Take 325 mg by mouth 3 (three) times daily with meals.   metoprolol tartrate 50 MG tablet Commonly known as:  LOPRESSOR Take 1 tablet (50 mg total) by mouth 2 (two) times daily.   omeprazole 20 MG capsule Commonly known as:  PRILOSEC Take 1 capsule (20 mg total) by mouth daily.   Ventolin HFA 108 (90 Base) MCG/ACT inhaler Generic drug:  albuterol Inhale 2 puffs into the lungs every 6 (six) hours as needed for wheezing or shortness of breath.   vitamin B-12 1000 MCG tablet Commonly known as:  CYANOCOBALAMIN Take 1,000 mcg by mouth 2 (two) times daily.      Follow-up Information    [facility]. Schedule an appointment as soon as possible for a visit in 1 week(s).   Contact information: [place] [id number] [phone]         Allergies  Allergen Reactions  . Nsaids Other (See Comments)    Pt. Stated she get severe stomach cramps .  Marland Kitchen Percocet [Oxycodone-Acetaminophen] Other (See Comments)    Severe stomach cramps    Consultations:  None    Procedures/Studies: Dg Chest 2 View  Result Date: 01/07/2019 CLINICAL DATA:  Tachycardia. EXAM: CHEST - 2 VIEW COMPARISON:  CT scan and radiographs of January 05, 2019. FINDINGS: The heart size and mediastinal  contours are within normal limits. Both lungs are clear. No pneumothorax or pleural effusion is noted. The visualized skeletal structures are unremarkable. IMPRESSION: No active cardiopulmonary disease. Electronically Signed   By: Marijo Conception, M.D.   On: 01/07/2019 13:43   Dg Chest 2 View  Result Date: 01/05/2019 CLINICAL DATA:  Initial evaluation for acute palpitations. EXAM: CHEST - 2 VIEW COMPARISON:  Prior radiograph from 11/28/2018. FINDINGS: The cardiac and mediastinal silhouettes are stable in size and contour, and remain within normal limits. The lungs are normally inflated. No airspace consolidation, pleural effusion, or pulmonary edema is identified. There is no pneumothorax. No acute osseous abnormality identified. IMPRESSION: No active cardiopulmonary disease. Electronically Signed   By: Jeannine Boga M.D.   On: 01/05/2019 18:37   Ct Angio Chest Pe W/cm &/or Wo Cm  Result Date: 01/05/2019 CLINICAL DATA:  28 year old female with tachycardia, palpitations. Abnormal D-dimer. EXAM: CT ANGIOGRAPHY CHEST WITH CONTRAST TECHNIQUE: Multidetector CT imaging of the chest was performed using the standard protocol during bolus  administration of intravenous contrast. Multiplanar CT image reconstructions and MIPs were obtained to evaluate the vascular anatomy. CONTRAST:  139 milliliters OMNIPAQUE IOHEXOL 350 MG/ML SOLN COMPARISON:  Chest radiographs earlier today. CTA chest 11/28/2018. FINDINGS: Cardiovascular: Poor contrast bolus timing in the pulmonary arterial tree despite a repeated attempt with a 2nd contrast dose (less than 150 Hounsfield units in the main PA both times). The central and proximal lower lobe pulmonary arteries are best evaluated and no filling defect is identified in those segments. Negative visible aorta. Stable cardiac size at the upper limits of normal. No pericardial effusion. Mediastinum/Nodes: Stable and negative. Lungs/Pleura: Major airways are patent. Similar low lung  volumes. Minor pulmonary atelectasis but no pleural effusion or other abnormal pulmonary opacity. Upper Abdomen: Stable and negative; partially visible postoperative changes to the stomach. Musculoskeletal: Negative. Review of the MIP images confirms the above findings. IMPRESSION: 1. Poor contrast bolus timing despite two attempts. 2. No central or proximal lower lobe pulmonary embolus. If clinical suspicion persists recommend Nuclear Medicine V/Q scan. 3. Otherwise negative chest CTA. Electronically Signed   By: Genevie Ann M.D.   On: 01/05/2019 21:05   US Pelvic Complete With Transvaginal  Result Date: 01/05/2019 CLINICAL DATA:  Chronic pelvic pain EXAM: TRANSABDOMINAL AND TRANSVAGINAL ULTRASOUND OF PELVIS DOPPLER ULTRASOUND OF OVARIES TECHNIQUE: Both transabdominal and transvaginal ultrasound examinations of the pelvis were performed. Transabdominal technique was performed for global imaging of the pelvis including uterus, ovaries, adnexal regions, and pelvic cul-de-sac. It was necessary to proceed with endovaginal exam following the transabdominal exam to visualize the uterus endometrium ovaries. Color and duplex Doppler ultrasound was utilized to evaluate blood flow to the ovaries. COMPARISON:  Ultrasound 04/29/2018 FINDINGS: Uterus Measurements: 8.1 x 5.3 x 5.5 cm = volume: 123.3 mL. Possible subtle iso to mildly hypoechoic anterior fundal mass measuring 2.7 x 2.5 x 2.1 cm. Endometrium Thickness: 13.6 mm.  No focal abnormality visualized. Right ovary Measurements: 2.8 x 1.9 x 2 cm = volume: 5.6 mL. Normal appearance/no adnexal mass. Left ovary Measurements: 4 x 2.6 x 3.1 cm = volume: 16.6 mL. Normal appearance/no adnexal mass. Pulsed Doppler evaluation of both ovaries demonstrates normal low-resistance arterial and venous waveforms. Other findings No abnormal free fluid. IMPRESSION: 1. Negative for ovarian torsion or suspicious ovarian mass lesion. 2. Possible anterior fundal fibroid. Electronically Signed    By: Donavan Foil M.D.   On: 01/05/2019 20:11    Echo IMPRESSIONS  1. The left ventricle has normal systolic function, with an ejection fraction of 55-60%. The cavity size was mildly dilated. Left ventricular diastolic parameters were normal.  2. The right ventricle has normal systolic function. The cavity was normal. There is no increase in right ventricular wall thickness.  3. The aortic valve was not well visualized.  4. The aortic root and ascending aorta are normal in size and structure.    Discharge Exam: Vitals:   01/09/19 0324 01/09/19 0830  BP: (!) 119/92 138/82  Pulse: 80 98  Resp: 17   Temp: 97.8 F (36.6 C)   SpO2: 100%     General: Pt is alert, awake, not in acute distress Cardiovascular: RRR, S1/S2 +, no rubs, no gallops Respiratory: CTA bilaterally, no wheezing, no rhonchi Abdominal: Soft, NT, ND, bowel sounds + Extremities: no edema, no cyanosis    The results of significant diagnostics from this hospitalization (including imaging, microbiology, ancillary and laboratory) are listed below for reference.     Microbiology: No results found for this or any previous visit (  from the past 240 hour(s)).   Labs: BNP (last 3 results) No results for input(s): BNP in the last 8760 hours. Basic Metabolic Panel: Recent Labs  Lab 01/05/19 1721 01/07/19 1328 01/08/19 0348  NA 136 136 138  K 3.3* 3.7 3.6  CL 111 111 111  CO2 17* 19* 22  GLUCOSE 94 98 79  BUN 9 7 5*  CREATININE 0.78 0.73 0.67  CALCIUM 9.1 8.9 8.7*   Liver Function Tests: Recent Labs  Lab 01/08/19 0348  AST 17  ALT 11  ALKPHOS 70  BILITOT 0.6  PROT 6.7  ALBUMIN 3.3*   No results for input(s): LIPASE, AMYLASE in the last 168 hours. No results for input(s): AMMONIA in the last 168 hours. CBC: Recent Labs  Lab 01/05/19 1721 01/07/19 1328 01/08/19 0348 01/09/19 0330  WBC 7.1 5.9 5.4 4.8  HGB 10.1* 10.2* 9.4* 9.7*  HCT 34.8* 35.5* 31.1* 32.0*  MCV 70.0* 70.0* 69.1* 69.0*  PLT 283  322 295 370   Cardiac Enzymes: No results for input(s): CKTOTAL, CKMB, CKMBINDEX, TROPONINI in the last 168 hours. BNP: Invalid input(s): POCBNP CBG: No results for input(s): GLUCAP in the last 168 hours. D-Dimer No results for input(s): DDIMER in the last 72 hours. Hgb A1c No results for input(s): HGBA1C in the last 72 hours. Lipid Profile No results for input(s): CHOL, HDL, LDLCALC, TRIG, CHOLHDL, LDLDIRECT in the last 72 hours. Thyroid function studies No results for input(s): TSH, T4TOTAL, T3FREE, THYROIDAB in the last 72 hours.  Invalid input(s): FREET3 Anemia work up Recent Labs    01/08/19 0800  FERRITIN 6*  TIBC 433  IRON 24*   Urinalysis    Component Value Date/Time   COLORURINE YELLOW 09/28/2018 [place] 09/28/2018 1136   LABSPEC 1.016 09/28/2018 1136   PHURINE 5.0 09/28/2018 1136   GLUCOSEU NEGATIVE 09/28/2018 1136   HGBUR NEGATIVE 09/28/2018 1136   BILIRUBINUR NEGATIVE 09/28/2018 1136   KETONESUR NEGATIVE 09/28/2018 1136   PROTEINUR NEGATIVE 09/28/2018 1136   NITRITE NEGATIVE 09/28/2018 1136   LEUKOCYTESUR TRACE (A) 09/28/2018 1136   Sepsis Labs Invalid input(s): PROCALCITONIN,  WBC,  LACTICIDVEN Microbiology No results found for this or any previous visit (from the past 240 hour(s)).   Patient was seen and examined on the day of discharge and was found to be in stable condition. Time coordinating discharge: 25 minutes including assessment and coordination of care, as well as examination of the patient.   SIGNED:  Dessa Phi, DO [facility] [email or web address] 01/09/2019, 1:24 PM

## 2019-01-13 ENCOUNTER — Ambulatory Visit: Payer: Medicaid Other | Attending: Pulmonary Disease | Admitting: Physician Assistant

## 2019-01-13 ENCOUNTER — Telehealth: Payer: Self-pay | Admitting: Internal Medicine

## 2019-01-13 ENCOUNTER — Other Ambulatory Visit: Payer: Self-pay

## 2019-01-13 VITALS — BP 127/89 | HR 99 | Temp 98.6°F | Resp 16 | Ht 66.0 in | Wt 271.8 lb

## 2019-01-13 DIAGNOSIS — G8929 Other chronic pain: Secondary | ICD-10-CM | POA: Insufficient documentation

## 2019-01-13 DIAGNOSIS — J45909 Unspecified asthma, uncomplicated: Secondary | ICD-10-CM | POA: Diagnosis not present

## 2019-01-13 DIAGNOSIS — Z09 Encounter for follow-up examination after completed treatment for conditions other than malignant neoplasm: Secondary | ICD-10-CM

## 2019-01-13 DIAGNOSIS — Z7901 Long term (current) use of anticoagulants: Secondary | ICD-10-CM | POA: Insufficient documentation

## 2019-01-13 DIAGNOSIS — D508 Other iron deficiency anemias: Secondary | ICD-10-CM | POA: Insufficient documentation

## 2019-01-13 DIAGNOSIS — Z8249 Family history of ischemic heart disease and other diseases of the circulatory system: Secondary | ICD-10-CM | POA: Diagnosis not present

## 2019-01-13 DIAGNOSIS — I10 Essential (primary) hypertension: Secondary | ICD-10-CM | POA: Insufficient documentation

## 2019-01-13 DIAGNOSIS — Z79899 Other long term (current) drug therapy: Secondary | ICD-10-CM | POA: Diagnosis not present

## 2019-01-13 DIAGNOSIS — R000 Tachycardia, unspecified: Secondary | ICD-10-CM | POA: Insufficient documentation

## 2019-01-13 DIAGNOSIS — R102 Pelvic and perineal pain: Secondary | ICD-10-CM | POA: Diagnosis not present

## 2019-01-13 DIAGNOSIS — K219 Gastro-esophageal reflux disease without esophagitis: Secondary | ICD-10-CM | POA: Insufficient documentation

## 2019-01-13 NOTE — Telephone Encounter (Signed)
A new hem appt has been scheduled for the pt to see Dr. Walden Field on 3/23 at 930am. Pt aware to arrive 30 minutes early.

## 2019-01-15 ENCOUNTER — Telehealth: Payer: Self-pay

## 2019-01-15 NOTE — Telephone Encounter (Signed)
Contacted patient for screening prior to appointment scheduled for 3/23. Patient denies travel within the past 2 weeks, denies respiratory symptoms or fever, and denies contact with anyone who has traveled recently. Patient also denies contact with anyone who is symptomatic. Patient is aware that she will be screened again at her appointment on Monday.

## 2019-01-18 ENCOUNTER — Telehealth: Payer: Self-pay | Admitting: Internal Medicine

## 2019-01-18 ENCOUNTER — Inpatient Hospital Stay: Payer: Medicaid Other | Admitting: Pulmonary Disease

## 2019-01-18 ENCOUNTER — Inpatient Hospital Stay: Payer: Medicaid Other | Attending: Internal Medicine | Admitting: Internal Medicine

## 2019-01-18 ENCOUNTER — Other Ambulatory Visit: Payer: Self-pay

## 2019-01-18 VITALS — BP 126/91 | HR 90 | Temp 98.4°F | Resp 18 | Ht 66.0 in | Wt 283.1 lb

## 2019-01-18 DIAGNOSIS — I10 Essential (primary) hypertension: Secondary | ICD-10-CM | POA: Insufficient documentation

## 2019-01-18 DIAGNOSIS — Z79899 Other long term (current) drug therapy: Secondary | ICD-10-CM | POA: Insufficient documentation

## 2019-01-18 DIAGNOSIS — D509 Iron deficiency anemia, unspecified: Secondary | ICD-10-CM | POA: Diagnosis present

## 2019-01-18 DIAGNOSIS — R55 Syncope and collapse: Secondary | ICD-10-CM

## 2019-01-18 DIAGNOSIS — G8929 Other chronic pain: Secondary | ICD-10-CM | POA: Insufficient documentation

## 2019-01-18 DIAGNOSIS — E669 Obesity, unspecified: Secondary | ICD-10-CM

## 2019-01-18 DIAGNOSIS — Z9884 Bariatric surgery status: Secondary | ICD-10-CM | POA: Diagnosis not present

## 2019-01-18 DIAGNOSIS — D508 Other iron deficiency anemias: Secondary | ICD-10-CM

## 2019-01-18 DIAGNOSIS — K5900 Constipation, unspecified: Secondary | ICD-10-CM | POA: Diagnosis not present

## 2019-01-18 DIAGNOSIS — R000 Tachycardia, unspecified: Secondary | ICD-10-CM | POA: Insufficient documentation

## 2019-01-18 DIAGNOSIS — R102 Pelvic and perineal pain: Secondary | ICD-10-CM | POA: Insufficient documentation

## 2019-01-18 NOTE — Progress Notes (Signed)
Referring Physician:  Freeman Caldron, PA-C  Diagnosis Other iron deficiency anemia - Plan: CBC with Differential ([facility] Only), CMP ([facility] only), Lactate dehydrogenase (LDH), Ferritin, Iron and TIBC, Folate, Serum, Vitamin B12, Hemoglobinopathy evaluation, Methylmalonic acid, serum, SPEP (Serum protein electrophoresis), Ambulatory referral to Gastroenterology  Staging Cancer Staging No matching staging information was found for the patient.  Assessment and Plan:  1.  Iron deficiency anemia.  28 year old female seen today for evaluation due to anemia.  Pt reports she fainted in February but was seen in ER and reportedly told no injury.  She has had no episodes since.  She reports ongoing problems with constipation and has not had a bowel movement for a week despite miralax.  She has been on oral iron for years.  She reports gastric sleeve surgery in 2018.  She denies menorrhagia or blood in stool or urine.  She denies family history of colon cancer.  Pt had labs done 01/08/2019 that showed WBC 4.8 Hb 9.7 plts 370,000.  Ferritin decreased at 6.  She reports she was transfused in 2015.  Pt is seen today for consultation due to iron deficiency anemia.    Likely etiology due to gastric sleeve and pt intolerance to oral iron due to gastric sleeve procedure.  She reports she has been on oral iron for several years.  Ferritin level is decreased at 6 which is consistent with IDA.  Pt will be set up for Feraheme 510 mg IV D1 and D8.  Side effects of medication reviewed.  She will will follow-up 4 - 6 weeks after IV iron for repeat labs.    2.  Constipation.  Pt reports she has not had bowel movement for a week despite Miralax. Pt had CT abdomen and pelvis done 09/28/2018 that showed IMPRESSION: 1. No acute findings within the abdomen or pelvis. No significant change compared with 04/29/2018. 2. There are several scattered low-density foci within the liver which are indeterminate. These were  present on exam from 04/29/2018 and are favored to represent a benign process such as multiple liver hemangiomas. Suggest followup imaging with nonemergent contrast enhanced MRI of the liver.   Pt advised to increase water intake.  She will discontinue oral iron.  Pt is referred to GI for evaluation due to persistent constipation and iron deficiency.  If ongoing symptoms will consider repeat imaging.    3.  Syncopal episode.  Pt was hospitalized and underwent work-up with medication changes.  Pt denies any episodes since discharge.  BP is 126/91.  Follow-up with PCP as directed.    4.  HTN.  BP is 126/91.  Pt on lopressor.  Follow-up with PCP.   5.  Health maintenance.  Pt referred to GI due to constipation and IDA.    40 minutes spent with more than 50% spent in review of records, counseling and coordination of care.    HPI:  28 year old female seen today for evaluation due to anemia.  Pt reports she fainted in February but was seen in ER and reportedly told no injury.  She has had no episodes since.  She reports ongoing problems with constipation and has not had a bowel movement for a week despite miralax.  She has been on oral iron for years.  She reports gastric sleeve surgery in 2018.  She denies menorrhagia or blood in stool or urine.  She denies family history of colon cancer.  Pt had labs done 01/08/2019 that showed WBC 4.8 Hb  9.7 plts 370,000.  Ferritin decreased at 6.  She reports she was transfused in 2015.  Pt is seen today for consultation due to iron deficiency anemia.    Problem List Patient Active Problem List   Diagnosis Date Noted  . Benign essential HTN [I10] 01/07/2019  . Sinus tachycardia [R00.0] 01/07/2019  . Chronic pelvic pain in female [R10.2, G89.29] 01/01/2019  . Chronic iron deficiency anemia [D50.9] 01/01/2019    Past Medical History Past Medical History:  Diagnosis Date  . Anemia   . Asthma   . Hypertension   . PCOS (polycystic ovarian syndrome)      Past Surgical History Past Surgical History:  Procedure Laterality Date  . LAPAROSCOPIC GASTRIC SLEEVE RESECTION    . TUBAL LIGATION      Family History Family History  Problem Relation Age of Onset  . Stroke Mother   . Diabetes Mother   . Hypertension Mother      Social History  reports that she is a non-smoker but has been exposed to tobacco smoke. She has never used smokeless tobacco. She reports that she does not drink alcohol or use drugs.  Medications  Current Outpatient Medications:  .  albuterol (VENTOLIN HFA) 108 (90 Base) MCG/ACT inhaler, Inhale 2 puffs into the lungs every 6 (six) hours as needed for wheezing or shortness of breath., Disp: , Rfl:  .  ferrous sulfate 325 (65 FE) MG tablet, Take 325 mg by mouth 3 (three) times daily with meals., Disp: , Rfl:  .  metoprolol tartrate (LOPRESSOR) 50 MG tablet, Take 1 tablet (50 mg total) by mouth 2 (two) times daily., Disp: 60 tablet, Rfl: 0 .  omeprazole (PRILOSEC) 20 MG capsule, Take 1 capsule (20 mg total) by mouth daily., Disp: 30 capsule, Rfl: 0 .  vitamin B-12 (CYANOCOBALAMIN) 1000 MCG tablet, Take 1,000 mcg by mouth 2 (two) times daily. , Disp: , Rfl:   Allergies Nsaids and Percocet [oxycodone-acetaminophen]  Review of Systems Review of Systems - Oncology ROS negative other than constipaiton   Physical Exam  Vitals Wt Readings from Last 3 Encounters:  01/18/19 283 lb 1.6 oz (128.4 kg)  01/13/19 271 lb 12.8 oz (123.3 kg)  01/09/19 268 lb 1.6 oz (121.6 kg)   Temp Readings from Last 3 Encounters:  01/18/19 98.4 F (36.9 C) (Oral)  01/13/19 98.6 F (37 C) (Oral)  01/09/19 97.8 F (36.6 C) (Oral)   BP Readings from Last 3 Encounters:  01/18/19 (!) 126/91  01/13/19 127/89  01/09/19 138/82   Pulse Readings from Last 3 Encounters:  01/18/19 90  01/13/19 99  01/09/19 98   Constitutional: Well-developed, well-nourished, and in no distress.   HENT: Head: Normocephalic and atraumatic.   Mouth/Throat: No oropharyngeal exudate. Mucosa moist. Eyes: Pupils are equal, round, and reactive to light. Conjunctivae are normal. No scleral icterus.  Neck: Normal range of motion. Neck supple. No JVD present.  Cardiovascular: Normal rate, regular rhythm and normal heart sounds.  Exam reveals no gallop and no friction rub.   No murmur heard. Pulmonary/Chest: Effort normal and breath sounds normal. No respiratory distress. No wheezes.No rales.  Abdominal: Soft. Bowel sounds are normal.  Obese, slightly tender to palpation, There is no guarding.  Musculoskeletal: No edema or tenderness.  Lymphadenopathy: No cervical,axillary or supraclavicular adenopathy.  Neurological: Alert and oriented to person, place, and time. No cranial nerve deficit.  Skin: Skin is warm and dry. No rash noted. No erythema. No pallor.  Psychiatric: Affect and judgment normal.  Labs No visits with results within 3 Day(s) from this visit.  Latest known visit with results is:  Admission on 01/07/2019, Discharged on 01/09/2019  Component Date Value Ref Range Status  . Sodium 01/07/2019 136  135 - 145 mmol/L Final  . Potassium 01/07/2019 3.7  3.5 - 5.1 mmol/L Final  . Chloride 01/07/2019 111  98 - 111 mmol/L Final  . CO2 01/07/2019 19* 22 - 32 mmol/L Final  . Glucose, Bld 01/07/2019 98  70 - 99 mg/dL Final  . BUN 01/07/2019 7  6 - 20 mg/dL Final  . Creatinine, Ser 01/07/2019 0.73  0.44 - 1.00 mg/dL Final  . Calcium 01/07/2019 8.9  8.9 - 10.3 mg/dL Final  . GFR calc non Af Amer 01/07/2019 >60  >60 mL/min Final  . GFR calc Af Amer 01/07/2019 >60  >60 mL/min Final  . Anion gap 01/07/2019 6  5 - 15 Final   Performed at [facility], [place] [address], [place], [place] [address]  . WBC 01/07/2019 5.9  4.0 - 10.5 K/uL Final  . RBC 01/07/2019 5.07  3.87 - 5.11 MIL/uL Final  . Hemoglobin 01/07/2019 10.2* 12.0 - 15.0 g/dL Final  . HCT 01/07/2019 35.5* 36.0 - 46.0 % Final  . MCV 01/07/2019 70.0* 80.0 - 100.0 fL Final   . MCH 01/07/2019 20.1* 26.0 - 34.0 pg Final  . MCHC 01/07/2019 28.7* 30.0 - 36.0 g/dL Final  . RDW 01/07/2019 19.0* 11.5 - 15.5 % Final  . Platelets 01/07/2019 322  150 - 400 K/uL Final  . nRBC 01/07/2019 0.0  0.0 - 0.2 % Final   Performed at [facility] [address] [place], [place] [address]  . Troponin i, poc 01/07/2019 0.00  0.00 - 0.08 ng/mL Final  . Comment 3 01/07/2019          Final   Comment: Due to the release kinetics of cTnI, a negative result within the first hours of the onset of symptoms does not rule out myocardial infarction with certainty. If myocardial infarction is still suspected, repeat the test at appropriate intervals.   . I-stat hCG, quantitative 01/07/2019 <5.0  <5 mIU/mL Final  . Comment 3 01/07/2019          Final   Comment:   GEST. AGE      CONC.  (mIU/mL)   <=1 WEEK        5 - 50     2 WEEKS       50 - 500     3 WEEKS       100 - 10,000     4 WEEKS     1,000 - 30,000        FEMALE AND NON-PREGNANT FEMALE:     LESS THAN 5 mIU/mL   . Weight 01/08/2019 4,371.2  oz Final  . Height 01/08/2019 66  in Final  . BP 01/08/2019 125/95  mmHg Final  . Free T4 01/07/2019 0.94  0.82 - 1.77 ng/dL Final   Comment: (NOTE) Biotin ingestion may interfere with free T4 tests. If the results are inconsistent with the TSH level, previous test results, or the clinical presentation, then consider biotin interference. If needed, order repeat testing after stopping biotin. Performed at [facility], [place] [address] [place],  [address]   . Sodium 01/08/2019 138  135 - 145 mmol/L Final  . Potassium 01/08/2019 3.6  3.5 - 5.1 mmol/L Final  . Chloride 01/08/2019 111  98 -  111 mmol/L Final  . CO2 01/08/2019 22  22 - 32 mmol/L Final  . Glucose, Bld 01/08/2019 79  70 - 99 mg/dL Final  . BUN 01/08/2019 5* 6 - 20 mg/dL Final  . Creatinine, Ser 01/08/2019 0.67  0.44 - 1.00 mg/dL Final  . Calcium 01/08/2019 8.7* 8.9 - 10.3 mg/dL Final  . Total Protein  01/08/2019 6.7  6.5 - 8.1 g/dL Final  . Albumin 01/08/2019 3.3* 3.5 - 5.0 g/dL Final  . AST 01/08/2019 17  15 - 41 U/L Final  . ALT 01/08/2019 11  0 - 44 U/L Final  . Alkaline Phosphatase 01/08/2019 70  38 - 126 U/L Final  . Total Bilirubin 01/08/2019 0.6  0.3 - 1.2 mg/dL Final  . GFR calc non Af Amer 01/08/2019 >60  >60 mL/min Final  . GFR calc Af Amer 01/08/2019 >60  >60 mL/min Final  . Anion gap 01/08/2019 5  5 - 15 Final   Performed at [facility], [place] [address], [place], [place] [address]  . WBC 01/08/2019 5.4  4.0 - 10.5 K/uL Final  . RBC 01/08/2019 4.50  3.87 - 5.11 MIL/uL Final  . Hemoglobin 01/08/2019 9.4* 12.0 - 15.0 g/dL Final  . HCT 01/08/2019 31.1* 36.0 - 46.0 % Final  . MCV 01/08/2019 69.1* 80.0 - 100.0 fL Final  . MCH 01/08/2019 20.9* 26.0 - 34.0 pg Final  . MCHC 01/08/2019 30.2  30.0 - 36.0 g/dL Final  . RDW 01/08/2019 18.7* 11.5 - 15.5 % Final  . Platelets 01/08/2019 295  150 - 400 K/uL Final  . nRBC 01/08/2019 0.0  0.0 - 0.2 % Final   Performed at [facility] [address] [place], [place] [address]  . Iron 01/08/2019 24* 28 - 170 ug/dL Final  . TIBC 01/08/2019 433  250 - 450 ug/dL Final  . Saturation Ratios 01/08/2019 6* 10.4 - 31.8 % Final  . UIBC 01/08/2019 409  ug/dL Final   Performed at [facility], [place] [address] [place], [place] [address]  . Ferritin 01/08/2019 6* 11 - 307 ng/mL Final   Performed at [facility], [place] [address], [place], [place] [address]  . WBC 01/09/2019 4.8  4.0 - 10.5 K/uL Final  . RBC 01/09/2019 4.64  3.87 - 5.11 MIL/uL Final  . Hemoglobin 01/09/2019 9.7* 12.0 - 15.0 g/dL Final  . HCT 01/09/2019 32.0* 36.0 - 46.0 % Final  . MCV 01/09/2019 69.0* 80.0 - 100.0 fL Final  . MCH 01/09/2019 20.9* 26.0 - 34.0 pg Final  . MCHC 01/09/2019 30.3  30.0 - 36.0 g/dL Final  . RDW 01/09/2019 19.2* 11.5 - 15.5 % Final  . Platelets 01/09/2019 370  150 - 400 K/uL Final  . nRBC 01/09/2019 0.0  0.0 - 0.2 % Final    Performed at [facility] [address] [place], [place] [address]     Pathology Orders Placed This Encounter  Procedures  . CBC with Differential ([facility] Only)    Standing Status:   Future    Standing Expiration Date:   01/18/2020  . CMP ([facility] only)    Standing Status:   Future    Standing Expiration Date:   01/18/2020  . Lactate dehydrogenase (LDH)    Standing Status:   Future    Standing Expiration Date:   01/18/2020  . Ferritin    Standing Status:   Future    Standing Expiration Date:   01/18/2020  .  Iron and TIBC    Standing Status:   Future    Standing Expiration Date:   01/18/2020  . Folate, Serum    Standing Status:   Future    Standing Expiration Date:   01/18/2020  . Vitamin B12    Standing Status:   Future    Standing Expiration Date:   01/18/2020  . Hemoglobinopathy evaluation    Standing Status:   Future    Standing Expiration Date:   01/18/2020  . Methylmalonic acid, serum    Standing Status:   Future    Standing Expiration Date:   01/18/2020  . SPEP (Serum protein electrophoresis)    Standing Status:   Future    Standing Expiration Date:   01/18/2020  . Ambulatory referral to Gastroenterology    Referral Priority:   Routine    Referral Type:   Consultation    Referral Reason:   Specialty Services Required    Number of Visits Requested:   1       Zoila Shutter MD

## 2019-01-18 NOTE — Telephone Encounter (Signed)
Scheduled appt per 3/23 los. ° °Printed calendar and avs. °

## 2019-01-21 ENCOUNTER — Encounter: Payer: Self-pay | Admitting: Physician Assistant

## 2019-01-21 ENCOUNTER — Ambulatory Visit: Payer: Medicaid Other | Admitting: Physician Assistant

## 2019-01-21 DIAGNOSIS — K5900 Constipation, unspecified: Secondary | ICD-10-CM

## 2019-01-21 DIAGNOSIS — R935 Abnormal findings on diagnostic imaging of other abdominal regions, including retroperitoneum: Secondary | ICD-10-CM

## 2019-01-21 DIAGNOSIS — D509 Iron deficiency anemia, unspecified: Secondary | ICD-10-CM

## 2019-01-21 DIAGNOSIS — K219 Gastro-esophageal reflux disease without esophagitis: Secondary | ICD-10-CM

## 2019-01-21 MED ORDER — LINACLOTIDE 72 MCG PO CAPS: 72.0000 ug | ORAL_CAPSULE | Freq: Every day | ORAL | 3 refills | Status: AC

## 2019-01-21 MED ORDER — NA SULFATE-K SULFATE-MG SULF 17.5-3.13-1.6 GM/177ML PO SOLN: 1.0000 | Freq: Once | ORAL | 0 refills | Status: AC

## 2019-01-21 NOTE — Patient Instructions (Signed)
As discussed you will need an EGD and colonoscopy in the near future for further evaluation of this chronic iron deficiency anemia.  Again this is non-urgent as you have been dealing with this since the age of 62.  For the moment please continue to follow with your hematologist in regards to iron infusions.  When we do schedule EGD and colonoscopy, it will be with Dr. Rush Landmark in our outpatient endoscopy center.  I will go ahead and prescribe a bowel prep for you.  Please take half of this today and half tomorrow. Please let us know if this does not work for you.  As discussed you will need an MRI of the liver for further evaluation of abnormal CT showing what are likely hemangiomas as discussed today. This will be scheduled within the next 1-2 months.  I will go ahead and send in a prescription for Linzess 72 mcg daily to start after doing the bowel prep.  Please let us know how this is working for you.  We will plan for you to have a follow-up in the next 1-2 months with me or Dr. Rush Landmark.  Thank you for allowing me to assist with your care today, Ellouise Newer, PA-C

## 2019-01-21 NOTE — Progress Notes (Signed)
TELEMEDICINE/VIRTUAL VISIT DUE TO COVID 19 CRISIS  Chief Complaint: Constipation, IDA, history of gastric sleeve, abnormal CT of the abdomen, GERD  HPI:    Peggy Andersen is a 28 year old female, who was referred to me by Zoila Shutter, MD for a complaint of iron deficiency anemia, history of gastric sleeve, abnormal CT of the abdomen and constipation.      01/09/2019 hemoglobin 9.7 (this appears to be patient's baseline over the past 8 months).  01/08/2019 iron studies show an iron low at 24, percent saturation low at 6, ferritin low at 6.    CT abdomen pelvis 09/28/2018 with no acute findings within the abdomen or pelvis.  No significant change compared to 04/29/2018.  Several scattered low-density foci within the liver which were indeterminate, was likely benign process such as multiple liver hemangiomas but suggested follow-up with nonemergent MRI.    Per chart review patient has been following with Dr. Walden Field with hematology oncology in regards to iron deficiency anemia.  Last visit 01/18/2019.  At that time, discussed being on oral iron for years.  Ongoing problems with constipation.  It was thought likely the etiology of this iron deficiency anemia was due to gastric sleeve and patient intolerance to oral iron due to sleeve procedure.  She was arranged for Feraheme infusions.  Referred to our office in regards to constipation and IDA.    Today, the patient explains that she has been battling anemia since age of 15.  Initially, they found a "cyst on my ovary", patient is unsure if this is related.  She has been on oral iron for years and has chronic fatigue related to this anemia.  Describes having a gastric sleeve procedure in December 2018, but again was anemic before this.  Denies having a previous thorough work-up for this anemia.    Patient also describes today that she has not had a bowel movement over the past week.  She just "has not had the urge to go".  Prior to this past week was having  regular bowel movements. She has been passing gas and using MiraLAX 3 times a day for the past 3 days as well as eating prunes and applesauce.  She has had no change in her appetite, is experiencing occasional nausea but no vomiting and describes minimal abdominal discomfort rated as a 2-3/10 which is not constant in general and is generalized.  Patient has noticed no blood in her stool or black/melenic stools.    Also chronic GERD controlled with omeprazole 20 mg daily.    Denies fever, chills, weight loss or symptoms that awaken her from sleep.  Past Medical History:  Diagnosis Date  . Anemia   . Asthma   . Hypertension   . PCOS (polycystic ovarian syndrome)     Past Surgical History:  Procedure Laterality Date  . LAPAROSCOPIC GASTRIC SLEEVE RESECTION    . TUBAL LIGATION      Current Outpatient Medications  Medication Sig Dispense Refill  . albuterol (VENTOLIN HFA) 108 (90 Base) MCG/ACT inhaler Inhale 2 puffs into the lungs every 6 (six) hours as needed for wheezing or shortness of breath.    . ferrous sulfate 325 (65 FE) MG tablet Take 325 mg by mouth 3 (three) times daily with meals.    . metoprolol tartrate (LOPRESSOR) 50 MG tablet Take 1 tablet (50 mg total) by mouth 2 (two) times daily. 60 tablet 0  . omeprazole (PRILOSEC) 20 MG capsule Take 1 capsule (20 mg total) by mouth  daily. 30 capsule 0  . vitamin B-12 (CYANOCOBALAMIN) 1000 MCG tablet Take 1,000 mcg by mouth 2 (two) times daily.      No current facility-administered medications for this visit.     Allergies as of 01/21/2019 - Review Complete 01/21/2019  Allergen Reaction Noted  . Nsaids Other (See Comments) 01/01/2019  . Percocet [oxycodone-acetaminophen] Other (See Comments) 11/28/2018    Family History  Problem Relation Age of Onset  . Stroke Mother   . Diabetes Mother   . Hypertension Mother     Social History   Socioeconomic History  . Marital status: Married    Spouse name: Not on file  . Number of  children: Not on file  . Years of education: Not on file  . Highest education level: Not on file  Occupational History  . Not on file  Social Needs  . Financial resource strain: Not on file  . Food insecurity:    Worry: Not on file    Inability: Not on file  . Transportation needs:    Medical: Not on file    Non-medical: Not on file  Tobacco Use  . Smoking status: Passive Smoke Exposure - Never Smoker  . Smokeless tobacco: Never Used  Substance and Sexual Activity  . Alcohol use: Never    Frequency: Never  . Drug use: Never  . Sexual activity: Yes  Lifestyle  . Physical activity:    Days per week: Not on file    Minutes per session: Not on file  . Stress: Not on file  Relationships  . Social connections:    Talks on phone: Not on file    Gets together: Not on file    Attends religious service: Not on file    Active member of club or organization: Not on file    Attends meetings of clubs or organizations: Not on file    Relationship status: Not on file  . Intimate partner violence:    Fear of current or ex partner: Not on file    Emotionally abused: Not on file    Physically abused: Not on file    Forced sexual activity: Not on file  Other Topics Concern  . Not on file  Social History Narrative  . Not on file    Review of Systems:    Constitutional: No weight loss, fever or chills Skin: No rash Cardiovascular: No chest pain Respiratory: No SOB  Gastrointestinal: See HPI and otherwise negative Genitourinary: No dysuria  Neurological: No headache, dizziness or syncope Musculoskeletal: No new muscle or joint pain Hematologic: No bleeding or bruising  Psychiatric: No history of depression or anxiety   Physical Exam: TELEMEDICINE/VIRTUAL VISIT- NO PHYSICAL EXAM   MOST RECENT LABS AND IMAGING: CBC    Component Value Date/Time   WBC 4.8 01/09/2019 0330   RBC 4.64 01/09/2019 0330   HGB 9.7 (L) 01/09/2019 0330   HCT 32.0 (L) 01/09/2019 0330   PLT 370  01/09/2019 0330   MCV 69.0 (L) 01/09/2019 0330   MCH 20.9 (L) 01/09/2019 0330   MCHC 30.3 01/09/2019 0330   RDW 19.2 (H) 01/09/2019 0330   LYMPHSABS 1.8 11/28/2018 1430   MONOABS 0.4 11/28/2018 1430   EOSABS 0.0 11/28/2018 1430   BASOSABS 0.0 11/28/2018 1430    CMP     Component Value Date/Time   NA 138 01/08/2019 0348   NA 141 01/01/2019 1734   K 3.6 01/08/2019 0348   CL 111 01/08/2019 0348   CO2 22 01/08/2019  0348   GLUCOSE 79 01/08/2019 0348   BUN 5 (L) 01/08/2019 0348   BUN 9 01/01/2019 1734   CREATININE 0.67 01/08/2019 0348   CALCIUM 8.7 (L) 01/08/2019 0348   PROT 6.7 01/08/2019 0348   PROT 6.6 01/01/2019 1734   ALBUMIN 3.3 (L) 01/08/2019 0348   ALBUMIN 4.1 01/01/2019 1734   AST 17 01/08/2019 0348   ALT 11 01/08/2019 0348   ALKPHOS 70 01/08/2019 0348   BILITOT 0.6 01/08/2019 0348   BILITOT <0.2 01/01/2019 1734   GFRNONAA >60 01/08/2019 0348   GFRAA >60 01/08/2019 0348    Assessment: 1.  Constipation: New for the patient over the past week, consider relation oral iron versus other, still passing gas 2.  IDA: Chronic for the patient since age of 26, now status post gastric sleeve in 2018 which is likely contributing to malabsorption of iron 3.  GERD: Controlled on Omeprazole 20 mg daily 4.  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  Explained to patient that she will need an EGD/colonoscopy in the future for evaluation of chronic iron deficiency anemia.  This is nonurgent as the patient has been experiencing anemia since age of 77 and has had no abrupt change in hemoglobin recently.  She is following with hematology/oncology for iron infusions.  Did go ahead and discuss risks, benefits, limitations and alternatives and the patient agrees to proceed.  When this is scheduled, it will be with Dr. Rush [facility]mark in [facility]. 2.  Prescribed Suprep for the patient today.  Explained she should take half of this prep  today or tomorrow and another half the next day if she does not feel completely empty.  She is to call if this is too expensive for her.  At that point we can try MiraLAX prep or other. 3.  Prescribed Linzess 72 mcg daily #30 with 3 refills.  Explained that patient should start this after finishing bowel prep.  If she starts with loose stools then would back off. 4.  Patient will need an MRI of the liver for further evaluation of abnormal CT.  Discussed this with her today.  Again this is non-emergent and will likely get scheduled sometime within the next 1 to 2 months. 5.  Patient will be arranged to follow-up in the next 1 to 2 months with Dr. Rush Landmark or other app.  This service was provided via telemedicine/virtual.  The patient was located at home.  The provider was located in in the office.  The patient did consent to this telephone visit and is aware of possible charges through their insurance for this visit.  The patient was referred by Dr. Walden Field.  There were no other persons participating in this telemedicine service.  Time spent on call: 21 minutes  Ellouise Newer, PA-C [facility] 01/21/2019, 9:06 AM  Cc: Zoila Shutter, MD

## 2019-01-21 NOTE — Progress Notes (Signed)
Agree with assessment and plan per PA Lemmon.

## 2019-01-25 ENCOUNTER — Inpatient Hospital Stay: Payer: Medicaid Other

## 2019-01-25 ENCOUNTER — Other Ambulatory Visit: Payer: Self-pay

## 2019-01-25 VITALS — BP 120/91 | HR 72 | Temp 98.4°F | Resp 17

## 2019-01-25 DIAGNOSIS — D509 Iron deficiency anemia, unspecified: Secondary | ICD-10-CM

## 2019-01-25 MED ORDER — SODIUM CHLORIDE 0.9 % IV SOLN
510.0000 mg | Freq: Once | INTRAVENOUS | Status: AC
  Administered 2019-01-25: 510 mg via INTRAVENOUS
  Filled 2019-01-25: qty 17

## 2019-01-25 MED ORDER — SODIUM CHLORIDE 0.9 % IV SOLN
Freq: Once | INTRAVENOUS | Status: AC
  Administered 2019-01-25: 09:00:00 via INTRAVENOUS
  Filled 2019-01-25: qty 250

## 2019-01-25 NOTE — Patient Instructions (Signed)
Coronavirus (COVID-19) Are you at risk?  Are you at risk for the Coronavirus (COVID-19)?  To be considered HIGH RISK for Coronavirus (COVID-19), you have to meet the following criteria:  . Traveled to [place], [place], [place], [place] or [place]; or in the [place] to [place], [place], [place], or [place]; and have fever, cough, and shortness of breath within the last 2 weeks of travel OR . Been in close contact with a person diagnosed with COVID-19 within the last 2 weeks and have fever, cough, and shortness of breath . IF YOU DO NOT MEET THESE CRITERIA, YOU ARE CONSIDERED LOW RISK FOR COVID-19.  What to do if you are HIGH RISK for COVID-19?  . If you are having a medical emergency, call 911. . Seek medical care right away. Before you go to a doctor's office, urgent care or emergency department, call ahead and tell them about your recent travel, contact with someone diagnosed with COVID-19, and your symptoms. You should receive instructions from your physician's office regarding next steps of care.  . When you arrive at healthcare provider, tell the healthcare staff immediately you have returned from visiting [place], [place], [place], [place] or [place]; or traveled in the [place] to [place], [place], [place], or [place]; in the last two weeks or you have been in close contact with a person diagnosed with COVID-19 in the last 2 weeks.   . Tell the health care staff about your symptoms: fever, cough and shortness of breath. . After you have been seen by a medical provider, you will be either: o Tested for (COVID-19) and discharged home on quarantine except to seek medical care if symptoms worsen, and asked to  - Stay home and avoid contact with others until you get your results (4-5 days)  - Avoid travel on public transportation if possible (such as bus, train, or airplane) or o Sent to the Emergency Department by EMS for evaluation, COVID-19 testing, and possible  admission depending on your condition and test results.  What to do if you are LOW RISK for COVID-19?  Reduce your risk of any infection by using the same precautions used for avoiding the common cold or flu:  . Wash your hands often with soap and warm water for at least 20 seconds.  If soap and water are not readily available, use an alcohol-based hand sanitizer with at least 60% alcohol.  . If coughing or sneezing, cover your mouth and nose by coughing or sneezing into the elbow areas of your shirt or coat, into a tissue or into your sleeve (not your hands). . Avoid shaking hands with others and consider head nods or verbal greetings only. . Avoid touching your eyes, nose, or mouth with unwashed hands.  . Avoid close contact with people who are sick. . Avoid places or events with large numbers of people in one location, like concerts or sporting events. . Carefully consider travel plans you have or are making. . If you are planning any travel outside or inside the [place], visit the CDC's Travelers' Health webpage for the latest health notices. . If you have some symptoms but not all symptoms, continue to monitor at home and seek medical attention if your symptoms worsen. . If you are having a medical emergency, call 911.  ADDITIONAL HEALTHCARE OPTIONS FOR PATIENTS  [facility] / e-Visit: [email or web address]         [facility]: [phone]  [facility]: [phone]                   [facility]: [phone]   Ferumoxytol injection What is this medicine? FERUMOXYTOL is an iron complex. Iron is used to make healthy red blood cells, which carry oxygen and nutrients throughout the body. This medicine is used to treat iron deficiency anemia. This medicine may be used for other purposes; ask your health care provider or pharmacist if you have questions. COMMON BRAND NAME(S): Feraheme What should I  tell my health care provider before I take this medicine? They need to know if you have any of these conditions: -anemia not caused by low iron levels -high levels of iron in the blood -magnetic resonance imaging (MRI) test scheduled -an unusual or allergic reaction to iron, other medicines, foods, dyes, or preservatives -pregnant or trying to get pregnant -breast-feeding How should I use this medicine? This medicine is for injection into a vein. It is given by a health care professional in a hospital or clinic setting. Talk to your pediatrician regarding the use of this medicine in children. Special care may be needed. Overdosage: If you think you have taken too much of this medicine contact a poison control center or emergency room at once. NOTE: This medicine is only for you. Do not share this medicine with others. What if I miss a dose? It is important not to miss your dose. Call your doctor or health care professional if you are unable to keep an appointment. What may interact with this medicine? This medicine may interact with the following medications: -other iron products This list may not describe all possible interactions. Give your health care provider a list of all the medicines, herbs, non-prescription drugs, or dietary supplements you use. Also tell them if you smoke, drink alcohol, or use illegal drugs. Some items may interact with your medicine. What should I watch for while using this medicine? Visit your doctor or healthcare professional regularly. Tell your doctor or healthcare professional if your symptoms do not start to get better or if they get worse. You may need blood work done while you are taking this medicine. You may need to follow a special diet. Talk to your doctor. Foods that contain iron include: whole grains/cereals, dried fruits, beans, or peas, leafy green vegetables, and organ meats (liver, kidney). What side effects may I notice from receiving this  medicine? Side effects that you should report to your doctor or health care professional as soon as possible: -allergic reactions like skin rash, itching or hives, swelling of the face, lips, or tongue -breathing problems -changes in blood pressure -feeling faint or lightheaded, falls -fever or chills -flushing, sweating, or hot feelings -swelling of the ankles or feet Side effects that usually do not require medical attention (report to your doctor or health care professional if they continue or are bothersome): -diarrhea -headache -nausea, vomiting -stomach pain This list may not describe all possible side effects. Call your doctor for medical advice about side effects. You may report side effects to FDA at 1-800-FDA-1088. Where should I keep my medicine? This drug is given in a hospital or clinic and will not be stored at home. NOTE: This sheet is a summary. It may not cover all possible information. If you have questions about this medicine, talk to your doctor, pharmacist, or health care provider.  2019 Elsevier/Gold Standard (2016-12-02 20:21:10)  

## 2019-01-26 ENCOUNTER — Other Ambulatory Visit: Payer: Self-pay

## 2019-01-26 NOTE — Telephone Encounter (Signed)
Patient is asking for Metoprolol refill.   Patient use [facility] on [address].

## 2019-01-27 MED ORDER — METOPROLOL TARTRATE 50 MG PO TABS: 50.0000 mg | ORAL_TABLET | Freq: Two times a day (BID) | ORAL | 1 refills | Status: AC

## 2019-01-29 ENCOUNTER — Ambulatory Visit: Payer: Medicaid Other | Admitting: Nurse Practitioner

## 2019-02-01 ENCOUNTER — Inpatient Hospital Stay: Payer: Medicaid Other

## 2019-02-15 ENCOUNTER — Other Ambulatory Visit: Payer: Medicaid Other

## 2019-02-18 ENCOUNTER — Other Ambulatory Visit: Payer: Self-pay

## 2019-02-18 ENCOUNTER — Other Ambulatory Visit: Payer: Self-pay | Admitting: Gastroenterology

## 2019-02-18 DIAGNOSIS — R9389 Abnormal findings on diagnostic imaging of other specified body structures: Secondary | ICD-10-CM

## 2019-02-18 NOTE — Progress Notes (Signed)
mrimri

## 2019-02-19 ENCOUNTER — Telehealth: Payer: Self-pay | Admitting: Internal Medicine

## 2019-02-19 NOTE — Telephone Encounter (Signed)
Spoke with patient re moving 4/27 appointments to 4/29. Per patient request appointments were moved from 4/29 to 5/6.

## 2019-02-22 ENCOUNTER — Ambulatory Visit: Payer: Medicaid Other | Admitting: Internal Medicine

## 2019-02-22 ENCOUNTER — Other Ambulatory Visit: Payer: Medicaid Other

## 2019-02-23 ENCOUNTER — Encounter: Payer: Medicaid Other | Admitting: Obstetrics & Gynecology

## 2019-02-24 ENCOUNTER — Inpatient Hospital Stay: Payer: Medicaid Other

## 2019-02-24 ENCOUNTER — Inpatient Hospital Stay: Payer: Medicaid Other | Admitting: Internal Medicine

## 2019-02-25 ENCOUNTER — Encounter: Payer: Medicaid Other | Admitting: Obstetrics & Gynecology

## 2019-02-26 ENCOUNTER — Telehealth: Payer: Self-pay | Admitting: *Deleted

## 2019-02-26 ENCOUNTER — Other Ambulatory Visit: Payer: Self-pay | Admitting: Internal Medicine

## 2019-02-26 NOTE — Telephone Encounter (Signed)
Called pt to inquire if pt could come in early next week for labs a few days before office visit.  Pt stated she is currently out of town, does not know when she will be back, and will not be able to keep appts as scheduled. Instructed pt to call office as soon as she is back in town to have labs drawn, and to reschedule visit with Dr. Walden Field when needed.   Pt voiced understanding. Appts cancelled as per pt's request.

## 2019-03-03 ENCOUNTER — Inpatient Hospital Stay: Payer: Medicaid Other | Admitting: Internal Medicine

## 2019-03-03 ENCOUNTER — Inpatient Hospital Stay: Payer: Medicaid Other

## 2019-03-09 ENCOUNTER — Other Ambulatory Visit: Payer: Medicaid Other

## 2019-03-09 ENCOUNTER — Ambulatory Visit: Payer: Medicaid Other | Admitting: Nurse Practitioner

## 2019-04-27 ENCOUNTER — Telehealth: Payer: Self-pay | Admitting: Internal Medicine

## 2020-09-18 IMAGING — CT CT ANGIOGRAPHY CHEST
4 of 19 series · 14 of 46 positions shown · IV contrast (omnipaque)
Comparison: Chest radiographs earlier today. CTA chest 11/28/2018.

CLINICAL DATA: 27-year-old female with tachycardia, palpitations.
Abnormal D-dimer.

EXAM:
CT ANGIOGRAPHY CHEST WITH CONTRAST
TECHNIQUE: Multidetector CT imaging of the chest was performed using the
standard protocol during bolus administration of intravenous
contrast. Multiplanar CT image reconstructions and MIPs were
obtained to evaluate the vascular anatomy.
CONTRAST:  139 milliliters OMNIPAQUE IOHEXOL 350 MG/ML SOLN

[Series 5: pe 3.0 i30f 1 · axial · 0.73mm/px · z∈[+1312,+1450]mm · 3 of 92 slices shown (1 of 2)]
[im 23/92  lung]
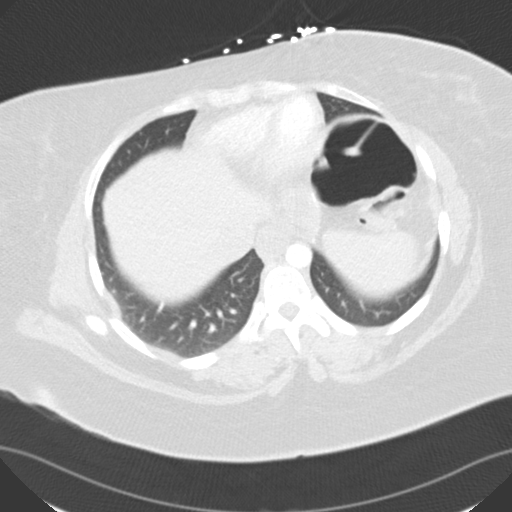
[im 46/92  soft-tissue]
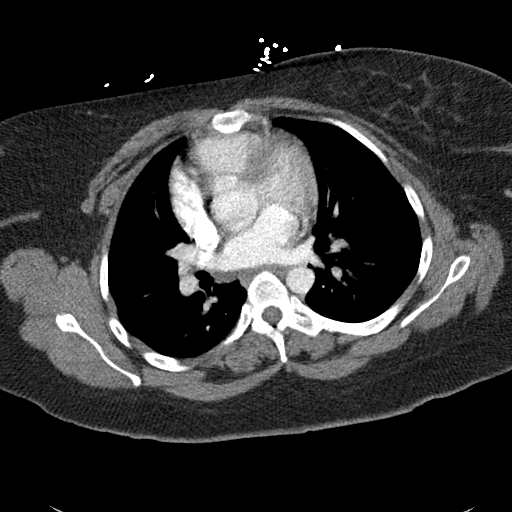
[im 69/92  lung]
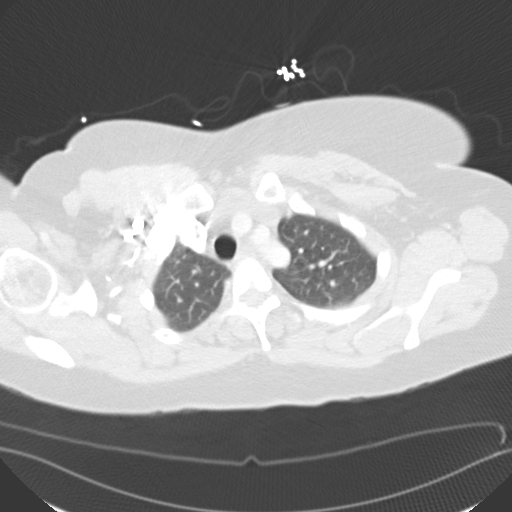

[Series 12: pe 3.0 i30f 1 · axial · 0.95mm/px · z∈[+1350,+1422]mm · 2 of 72 slices shown (2 of 2)]
[im 24/72  lung]
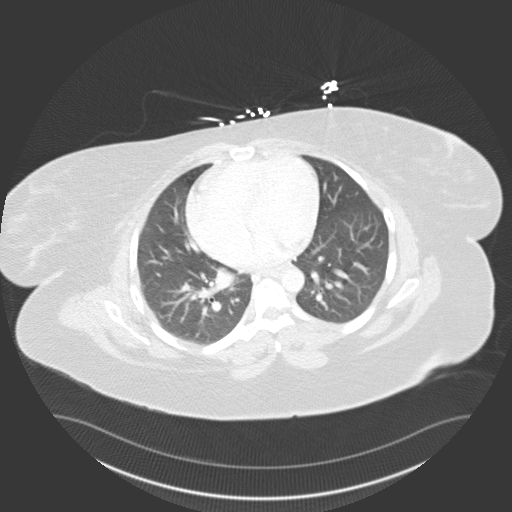
[im 48/72  lung]
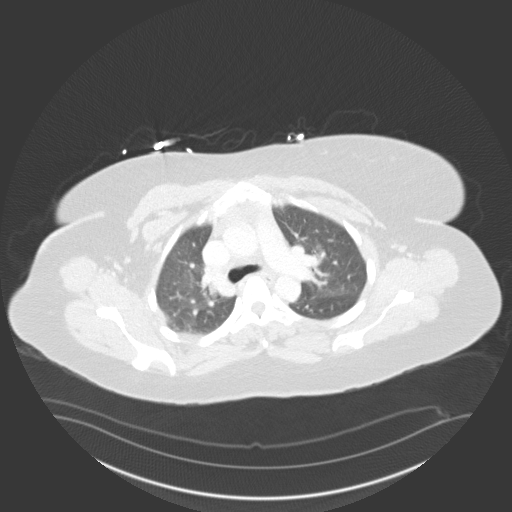

[Series 13: thins · axial · 0.95mm/px · z∈[+1296,+1478]mm · 8 of 214 slices shown]
[im 16/214  lung]
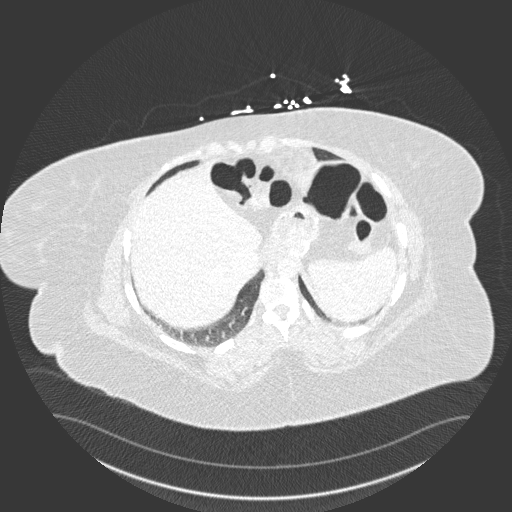
[im 46/214  lung]
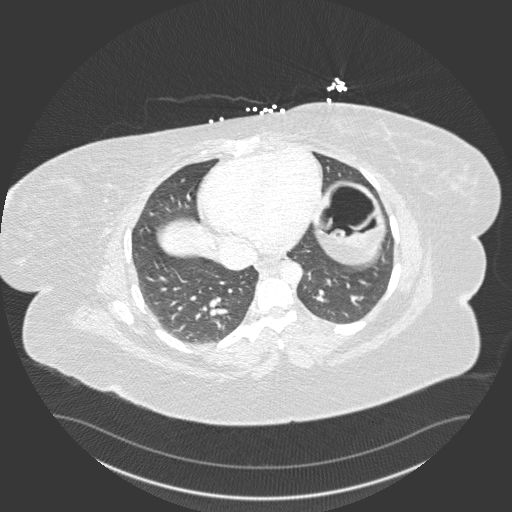
[im 77/214  lung]
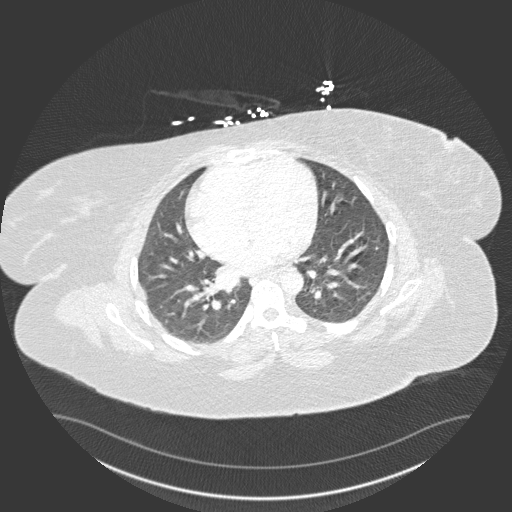
[im 92/214  lung]
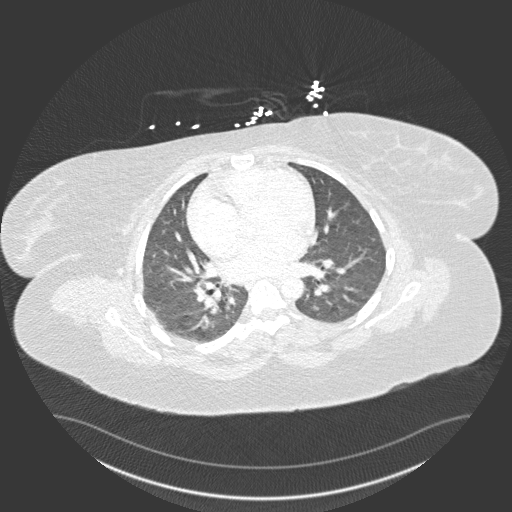
[im 122/214  lung]
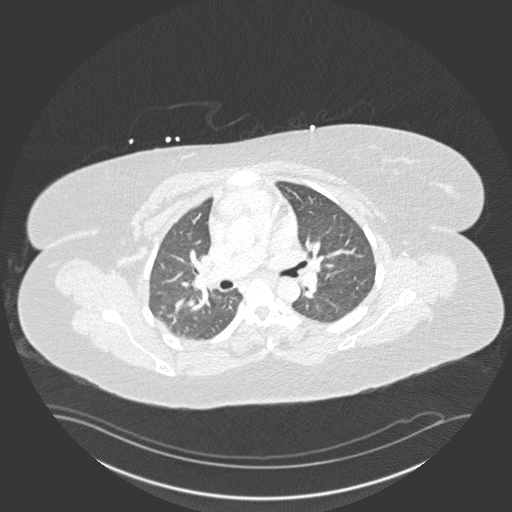
[im 137/214  lung]
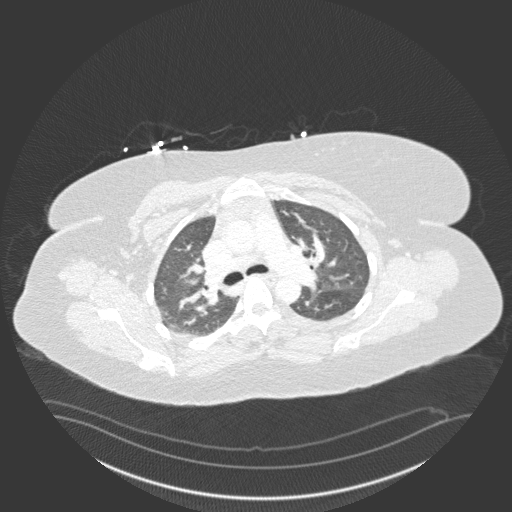
[im 168/214  lung]
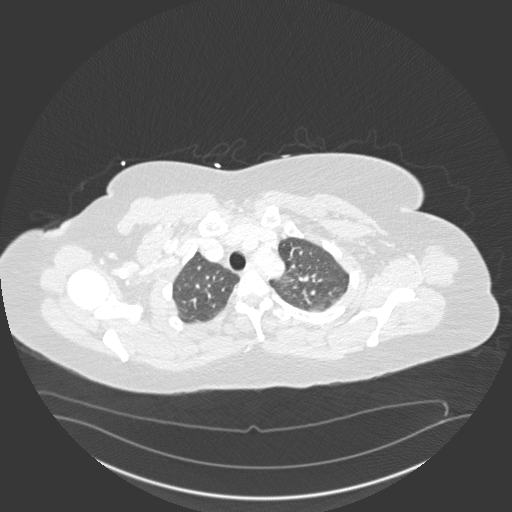
[im 198/214  lung]
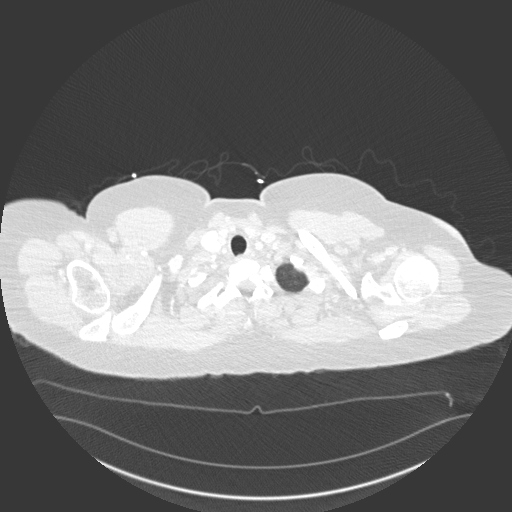

[Series 15: coronal mpr · coronal · 0.48mm/px · 1 of 162 slices shown]
[im 81/162  soft-tissue]
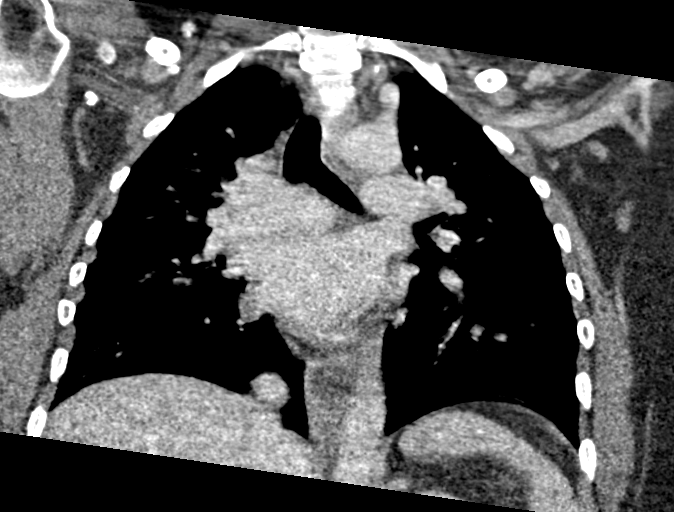

[14 of 46 positions shown; findings below may reference images not displayed]

FINDINGS: Cardiovascular: Poor contrast bolus timing in the pulmonary arterial
tree despite a repeated attempt with a 2nd contrast dose (less than
150 Hounsfield units in the main PA both times).

The central and proximal lower lobe pulmonary arteries are best
evaluated and no filling defect is identified in those segments.

Negative visible aorta. Stable cardiac size at the upper limits of
normal. No pericardial effusion.

Mediastinum/Nodes: Stable and negative.

Lungs/Pleura: Major airways are patent. Similar low lung volumes.
Minor pulmonary atelectasis but no pleural effusion or other
abnormal pulmonary opacity.

Upper Abdomen: Stable and negative; partially visible postoperative
changes to the stomach.

Musculoskeletal: Negative.

Review of the MIP images confirms the above findings.
IMPRESSION: 1. Poor contrast bolus timing despite two attempts.
2. No central or proximal lower lobe pulmonary embolus. If clinical
suspicion persists recommend Nuclear Medicine V/Q scan.
3. Otherwise negative chest CTA.

## 2020-09-22 IMAGING — US US PELVIS COMPLETE WITH TRANSVAGINAL
1 series · 13 of 25 positions shown · non-contrast
Comparison: Ultrasound 04/29/2018

CLINICAL DATA: Chronic pelvic pain

EXAM:
TRANSABDOMINAL AND TRANSVAGINAL ULTRASOUND OF PELVIS
DOPPLER ULTRASOUND OF OVARIES
TECHNIQUE: Both transabdominal and transvaginal ultrasound examinations of the
pelvis were performed. Transabdominal technique was performed for
global imaging of the pelvis including uterus, ovaries, adnexal
regions, and pelvic cul-de-sac.
It was necessary to proceed with endovaginal exam following the
transabdominal exam to visualize the uterus endometrium ovaries.
Color and duplex Doppler ultrasound was utilized to evaluate blood
flow to the ovaries.

[Series 1: us pelvis complete with transvaginal · 0.23mm/px · 13 of 99 slices shown]
[im 1/99]
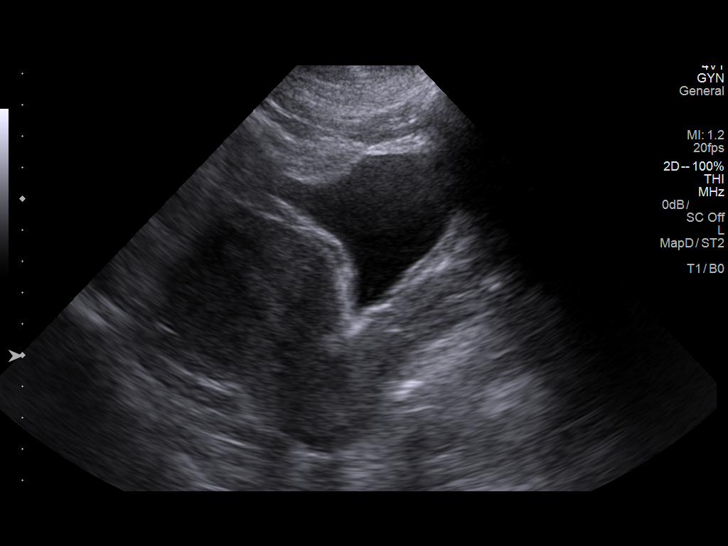
[im 9/99]
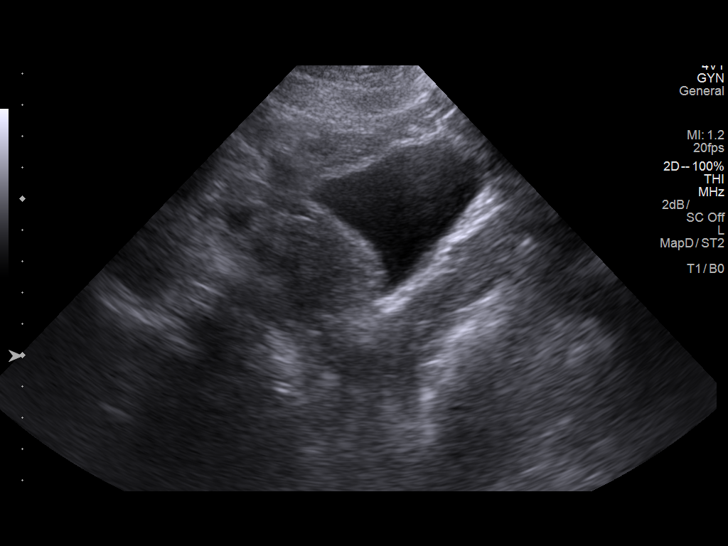
[im 17/99]
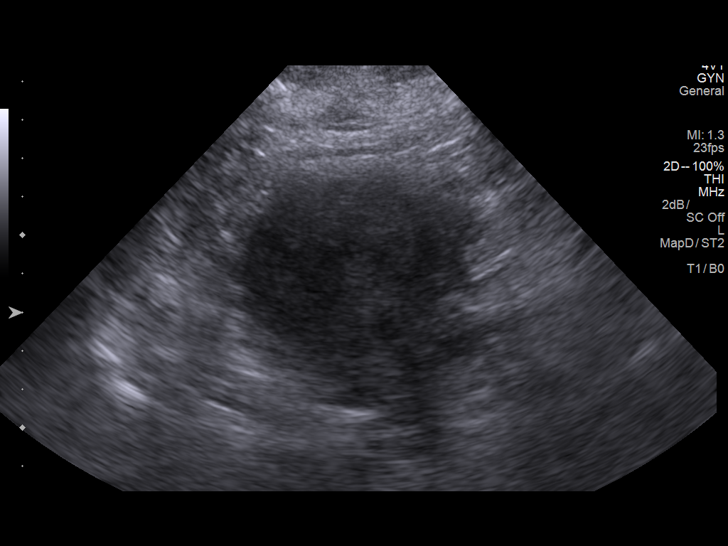
[im 25/99]
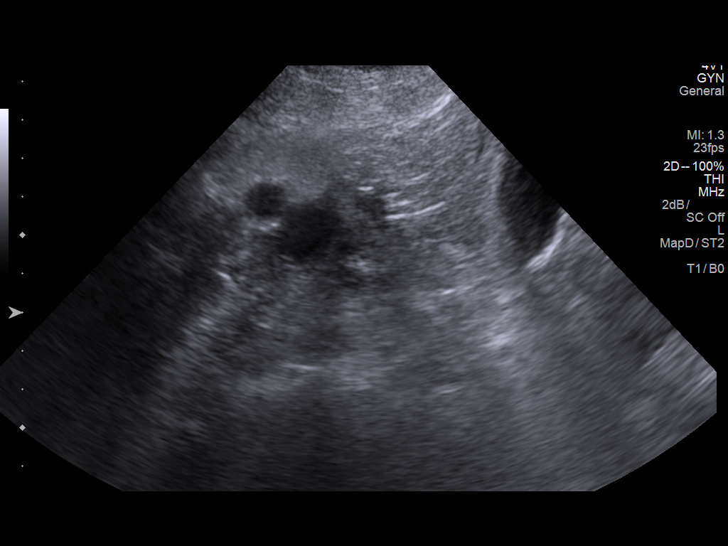
[im 33/99]
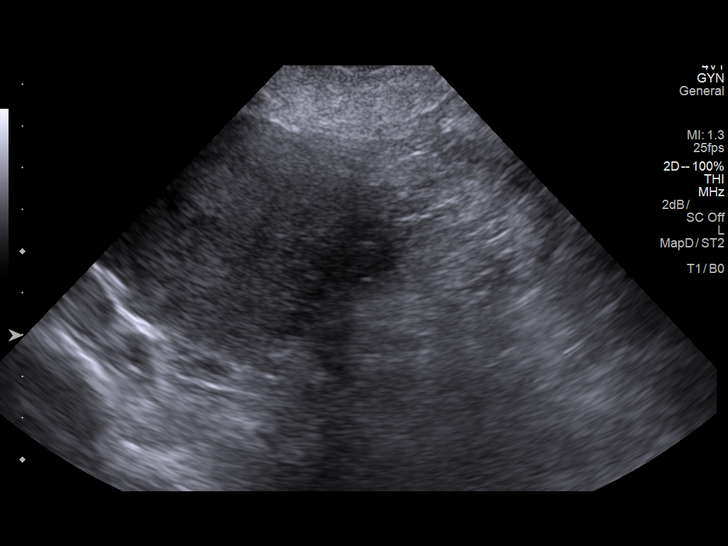
[im 41/99]
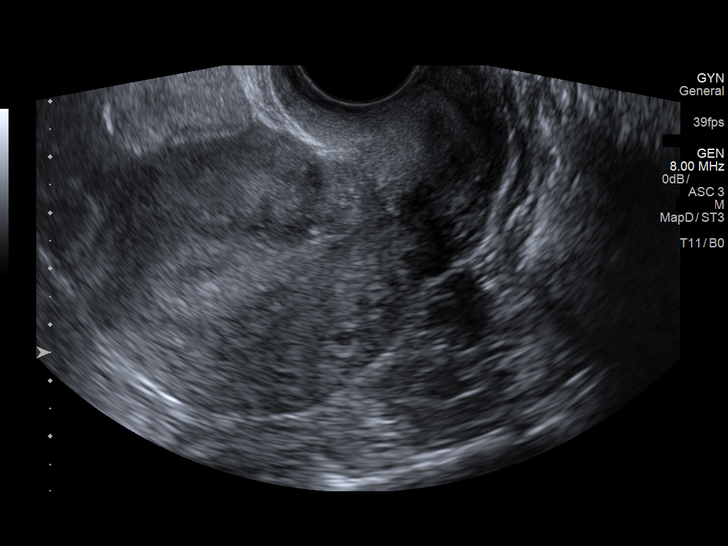
[im 50/99]
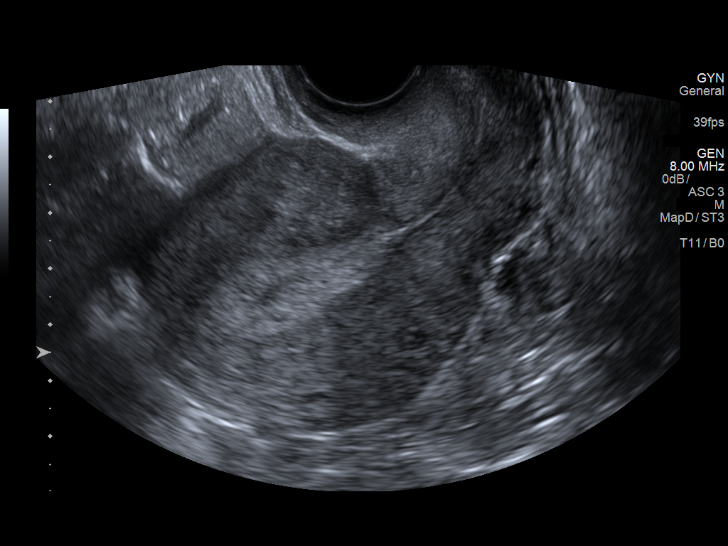
[im 58/99]
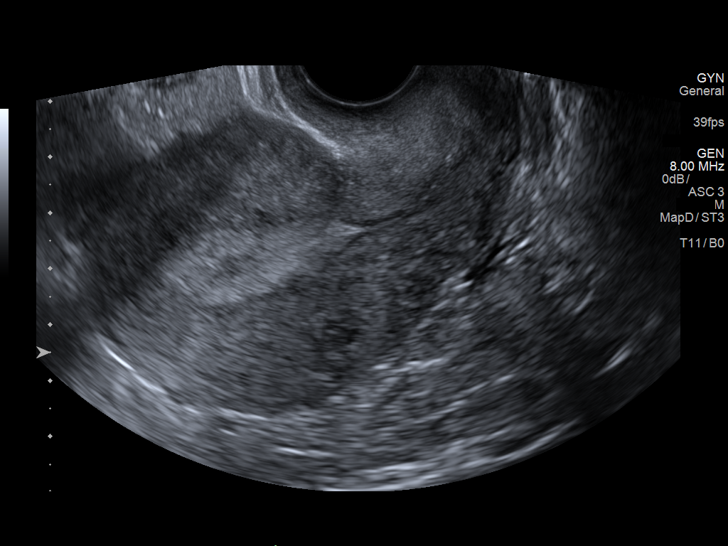
[im 66/99]
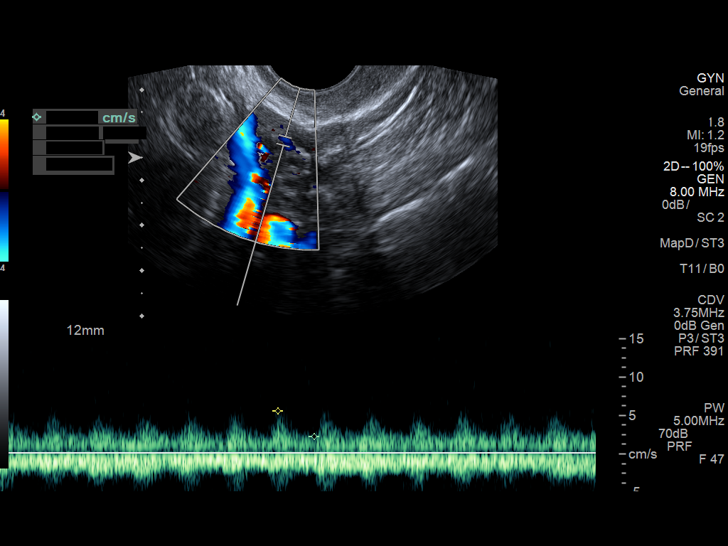
[im 74/99]
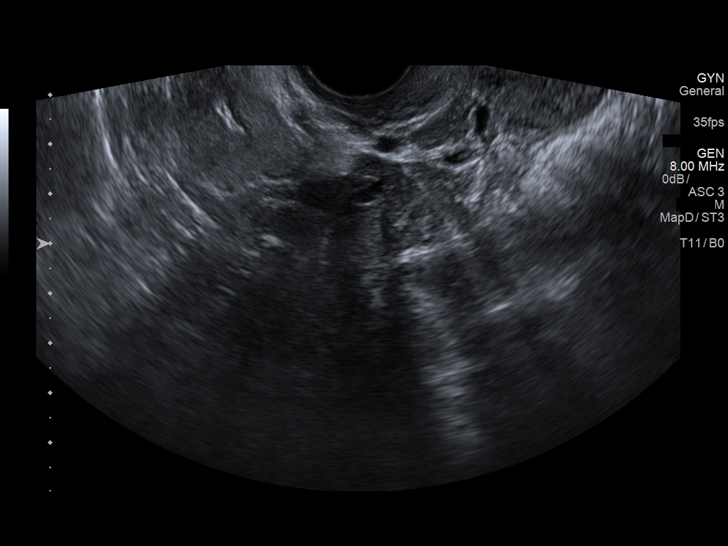
[im 82/99]
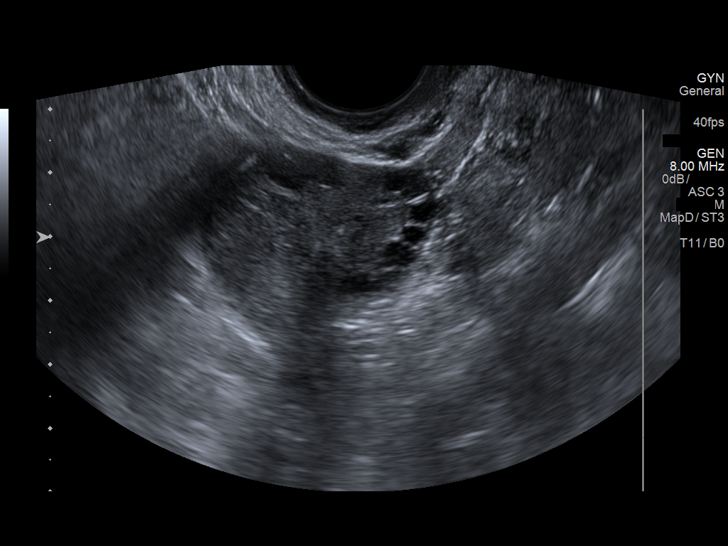
[im 90/99]
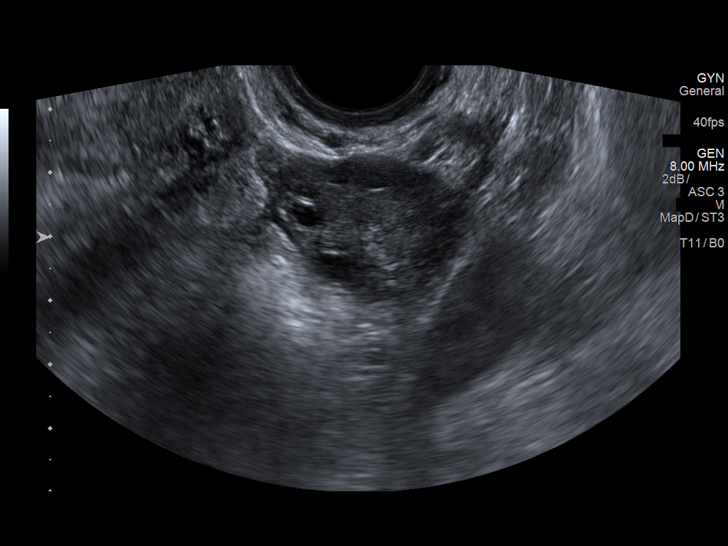
[im 99/99]
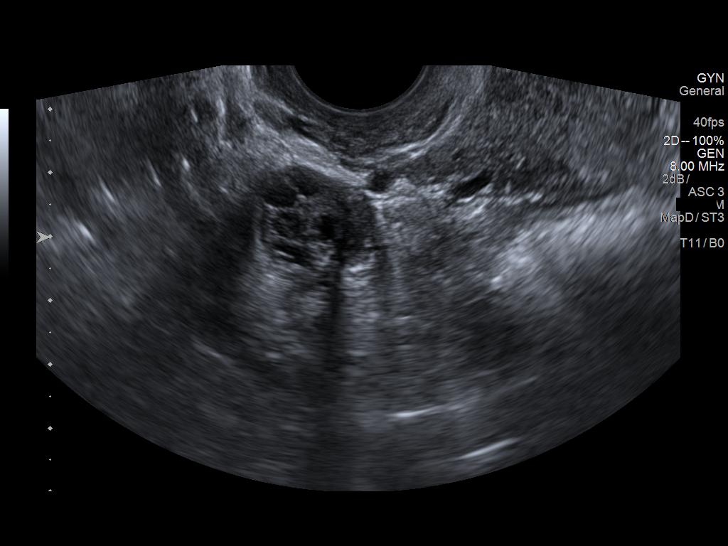

[13 of 25 positions shown; findings below may reference images not displayed]

FINDINGS: Uterus

Measurements: 8.1 x 5.3 x 5.5 cm = volume: 123.3 mL. Possible subtle
iso to mildly hypoechoic anterior fundal mass measuring 2.7 x 2.5 x
2.1 cm.

Endometrium

Thickness: 13.6 mm.  No focal abnormality visualized.

Right ovary

Measurements: 2.8 x 1.9 x 2 cm = volume: 5.6 mL. Normal
appearance/no adnexal mass.

Left ovary

Measurements: 4 x 2.6 x 3.1 cm = volume: 16.6 mL. Normal
appearance/no adnexal mass.

Pulsed Doppler evaluation of both ovaries demonstrates normal
low-resistance arterial and venous waveforms.

Other findings

No abnormal free fluid.
IMPRESSION: 1. Negative for ovarian torsion or suspicious ovarian mass lesion.
2. Possible anterior fundal fibroid.
